# Patient Record
Sex: Female | Born: 1935
Health system: Southern US, Community
[De-identification: ages and names within clinical notes are randomized; demographics above are authoritative.]

---

## 1999-07-02 ENCOUNTER — Encounter: Payer: Self-pay | Admitting: Emergency Medicine

## 1999-07-02 ENCOUNTER — Emergency Department (HOSPITAL_COMMUNITY): Admission: EM | Admit: 1999-07-02 | Discharge: 1999-07-02 | Payer: Self-pay | Admitting: Emergency Medicine

## 1999-08-14 ENCOUNTER — Other Ambulatory Visit: Admission: RE | Admit: 1999-08-14 | Discharge: 1999-08-14 | Payer: Self-pay | Admitting: Obstetrics and Gynecology

## 1999-08-27 ENCOUNTER — Encounter: Payer: Self-pay | Admitting: Obstetrics and Gynecology

## 1999-08-27 ENCOUNTER — Ambulatory Visit (HOSPITAL_COMMUNITY): Admission: RE | Admit: 1999-08-27 | Discharge: 1999-08-27 | Payer: Self-pay | Admitting: Obstetrics and Gynecology

## 2000-09-30 ENCOUNTER — Other Ambulatory Visit: Admission: RE | Admit: 2000-09-30 | Discharge: 2000-09-30 | Payer: Self-pay | Admitting: Obstetrics and Gynecology

## 2000-09-30 ENCOUNTER — Ambulatory Visit (HOSPITAL_COMMUNITY): Admission: RE | Admit: 2000-09-30 | Discharge: 2000-09-30 | Payer: Self-pay | Admitting: Obstetrics and Gynecology

## 2000-09-30 ENCOUNTER — Encounter: Payer: Self-pay | Admitting: Obstetrics and Gynecology

## 2001-06-23 ENCOUNTER — Ambulatory Visit (HOSPITAL_COMMUNITY): Admission: RE | Admit: 2001-06-23 | Discharge: 2001-06-23 | Payer: Self-pay | Admitting: *Deleted

## 2001-06-23 ENCOUNTER — Encounter (INDEPENDENT_AMBULATORY_CARE_PROVIDER_SITE_OTHER): Payer: Self-pay | Admitting: *Deleted

## 2001-11-10 ENCOUNTER — Ambulatory Visit (HOSPITAL_COMMUNITY): Admission: RE | Admit: 2001-11-10 | Discharge: 2001-11-10 | Payer: Self-pay | Admitting: Pediatrics

## 2001-11-10 ENCOUNTER — Encounter: Payer: Self-pay | Admitting: Obstetrics and Gynecology

## 2002-11-07 ENCOUNTER — Other Ambulatory Visit: Admission: RE | Admit: 2002-11-07 | Discharge: 2002-11-07 | Payer: Self-pay | Admitting: Obstetrics and Gynecology

## 2002-11-16 ENCOUNTER — Ambulatory Visit (HOSPITAL_COMMUNITY): Admission: RE | Admit: 2002-11-16 | Discharge: 2002-11-16 | Payer: Self-pay | Admitting: Obstetrics and Gynecology

## 2002-11-16 ENCOUNTER — Encounter: Payer: Self-pay | Admitting: Obstetrics and Gynecology

## 2003-11-01 ENCOUNTER — Encounter: Admission: RE | Admit: 2003-11-01 | Discharge: 2003-11-01 | Payer: Self-pay | Admitting: Orthopaedic Surgery

## 2003-12-09 ENCOUNTER — Encounter: Admission: RE | Admit: 2003-12-09 | Discharge: 2003-12-09 | Payer: Self-pay | Admitting: Family Medicine

## 2004-06-03 ENCOUNTER — Encounter: Admission: RE | Admit: 2004-06-03 | Discharge: 2004-06-03 | Payer: Self-pay | Admitting: Orthopaedic Surgery

## 2004-06-05 ENCOUNTER — Encounter: Admission: RE | Admit: 2004-06-05 | Discharge: 2004-06-05 | Payer: Self-pay | Admitting: Orthopaedic Surgery

## 2004-06-18 ENCOUNTER — Other Ambulatory Visit: Admission: RE | Admit: 2004-06-18 | Discharge: 2004-06-18 | Payer: Self-pay | Admitting: Family Medicine

## 2004-07-01 ENCOUNTER — Ambulatory Visit (HOSPITAL_COMMUNITY): Admission: RE | Admit: 2004-07-01 | Discharge: 2004-07-01 | Payer: Self-pay | Admitting: Orthopedic Surgery

## 2005-01-12 ENCOUNTER — Encounter: Admission: RE | Admit: 2005-01-12 | Discharge: 2005-01-12 | Payer: Self-pay | Admitting: Family Medicine

## 2006-04-04 ENCOUNTER — Encounter: Admission: RE | Admit: 2006-04-04 | Discharge: 2006-04-04 | Payer: Self-pay | Admitting: Family Medicine

## 2006-12-12 ENCOUNTER — Encounter: Admission: RE | Admit: 2006-12-12 | Discharge: 2006-12-12 | Payer: Self-pay | Admitting: Family Medicine

## 2007-04-07 ENCOUNTER — Encounter: Admission: RE | Admit: 2007-04-07 | Discharge: 2007-04-07 | Payer: Self-pay | Admitting: Family Medicine

## 2007-08-08 ENCOUNTER — Other Ambulatory Visit: Admission: RE | Admit: 2007-08-08 | Discharge: 2007-08-08 | Payer: Self-pay | Admitting: Family Medicine

## 2008-04-22 ENCOUNTER — Encounter: Admission: RE | Admit: 2008-04-22 | Discharge: 2008-04-22 | Payer: Self-pay | Admitting: Family Medicine

## 2009-03-30 ENCOUNTER — Ambulatory Visit (HOSPITAL_BASED_OUTPATIENT_CLINIC_OR_DEPARTMENT_OTHER): Admission: RE | Admit: 2009-03-30 | Discharge: 2009-03-30 | Payer: Self-pay | Admitting: Family Medicine

## 2009-03-30 ENCOUNTER — Encounter: Payer: Self-pay | Admitting: Internal Medicine

## 2009-04-05 ENCOUNTER — Ambulatory Visit: Payer: Self-pay | Admitting: Internal Medicine

## 2009-05-22 ENCOUNTER — Ambulatory Visit: Payer: Self-pay | Admitting: Internal Medicine

## 2009-05-22 DIAGNOSIS — G4700 Insomnia, unspecified: Secondary | ICD-10-CM | POA: Insufficient documentation

## 2009-05-22 DIAGNOSIS — J309 Allergic rhinitis, unspecified: Secondary | ICD-10-CM | POA: Insufficient documentation

## 2009-06-06 ENCOUNTER — Encounter: Admission: RE | Admit: 2009-06-06 | Discharge: 2009-06-06 | Payer: Self-pay | Admitting: Family Medicine

## 2009-12-02 ENCOUNTER — Ambulatory Visit (HOSPITAL_COMMUNITY): Admission: RE | Admit: 2009-12-02 | Discharge: 2009-12-03 | Payer: Self-pay | Admitting: Surgery

## 2009-12-02 ENCOUNTER — Encounter (INDEPENDENT_AMBULATORY_CARE_PROVIDER_SITE_OTHER): Payer: Self-pay | Admitting: Surgery

## 2010-04-03 ENCOUNTER — Encounter: Admission: RE | Admit: 2010-04-03 | Discharge: 2010-04-03 | Payer: Self-pay | Admitting: Orthopedic Surgery

## 2010-07-01 ENCOUNTER — Encounter: Admission: RE | Admit: 2010-07-01 | Discharge: 2010-07-01 | Payer: Self-pay | Admitting: Orthopedic Surgery

## 2010-08-11 ENCOUNTER — Encounter: Admission: RE | Admit: 2010-08-11 | Discharge: 2010-08-11 | Payer: Self-pay | Admitting: Family Medicine

## 2010-11-30 LAB — COMPREHENSIVE METABOLIC PANEL
ALT: 23 U/L (ref 0–35)
Alkaline Phosphatase: 61 U/L (ref 39–117)
CO2: 28 mEq/L (ref 19–32)
Calcium: 9.3 mg/dL (ref 8.4–10.5)
Chloride: 104 mEq/L (ref 96–112)
GFR calc Af Amer: >60 mL/min (ref >60)
Glucose, Bld: 88 mg/dL (ref 70–99)
Potassium: 4.6 mEq/L (ref 3.5–5.1)
Total Protein: 7.4 g/dL (ref 6.0–8.3)

## 2010-11-30 LAB — DIFFERENTIAL
Basophils Absolute: 0.1 10*3/uL (ref 0.0–0.1)
Lymphocytes Relative: 28 % (ref 12–46)
Lymphs Abs: 1.7 10*3/uL (ref 0.7–4.0)
Neutrophils Relative %: 59 % (ref 43–77)

## 2010-11-30 LAB — PROTIME-INR
INR: 0.97 (ref 0.00–1.49)
Prothrombin Time: 12.8 seconds (ref 11.6–15.2)

## 2010-11-30 LAB — CBC
Hemoglobin: 14.3 g/dL (ref 12.0–15.0)
MCV: 93.2 fL (ref 78.0–100.0)
RBC: 4.55 MIL/uL (ref 3.87–5.11)
WBC: 6.1 10*3/uL (ref 4.0–10.5)

## 2010-11-30 LAB — URINALYSIS, ROUTINE W REFLEX MICROSCOPIC
Glucose, UA: NEGATIVE mg/dL
Ketones, ur: NEGATIVE mg/dL
Specific Gravity, Urine: 1.017 (ref 1.005–1.030)

## 2010-11-30 LAB — URINE MICROSCOPIC-ADD ON

## 2011-01-19 NOTE — Procedures (Signed)
Katie Hahn, Katie Hahn                  ACCOUNT NO.:  [id number]   MEDICAL RECORD NO.:  [id number]          PATIENT TYPE:  OUT   LOCATION:  SLEEP CENTER                 FACILITY:  [facility]   PHYSICIAN:  Clinton D. Maple Hudson, MD, FCCP, FACPDATE OF BIRTH:  04-04-35   DATE OF STUDY:  03/30/2009                            NOCTURNAL POLYSOMNOGRAM   REFERRING PHYSICIAN:  Molly Maduro L. Foy [place], M.D.   INDICATION FOR STUDY:  Insomnia with sleep apnea.   EPWORTH SLEEPINESS SCORE:  Epworth sleepiness score 12/24, BMI 23.3.  Weight 140 pounds, height 65 inches.  Neck 14 inches.   HOME MEDICATIONS:  Charted and reviewed.   SLEEP ARCHITECTURE:  Total sleep time 104 minutes with sleep efficiency  26.6%.  Stage I was 25.5%, stage II was 74.5%, stages III and REM were  absent.  Sleep latency 148 minutes.  Awake after sleep onset 98 minutes,  arousal index 87.1 indicating increased EEG arousal.  No bedtime  medications were taken.   RESPIRATORY DATA:  Apnea/hypopnea index (AHI) 0 per hour.  An additional  43 events not meeting duration and intensity criteria for standard  diagnoses as apneas or hypopneas, were counted as respiratory effort  related arousals giving a cumulative respiratory disturbance index (RDI)  of 24.8 per hour.  There were insufficient apneas and hypopneas to  permit CPAP titration by split protocol on the study night.  The patient  also indicated that she could not sleep with door closed and could not  stand anything on her skin, including a trial of CPAP mask.   OXYGEN DATA:  Mild snoring with oxygen desaturation to a nadir of 93%.  Mean oxygen saturation through the study 96.4% on room air.   CARDIAC DATA:  Normal sinus rhythm.   MOVEMENT-PARASOMNIA:  Frequent limb jerks with a total of 76 of which 34  were associated with arousal or awakening for periodic limb movement  with arousal index of 19.6 per hour.   IMPRESSIONS-RECOMMENDATIONS:  1. Sleep architecture marked by delayed onset  at 11:30 p.m. and      frequent waking through the night before final waking a little      after 3:00 a.m.  Absent deeper stages including stages III and      REM.  No bedtime medication was reported although she does have      Lunesta, hydroxyzine and diazepam listed.  Note that beta blockers      such as propranolol have been associated with sleep disturbance,      insomnia and in some cases vivid dreaming or nightmares.  2. Periodic limb movement with arousal syndrome, 19.6 per hour.      Consider trial of specific therapy such as ReQuip or Mirapex.  An      alternative to sedative muscle relaxer could be clonazepam in the      appropriate clinical situation.  3. Mild respiratory sleep disturbance.  Her AHI was 0 but counting      milder and less specific respiratory effort related arousals for an      RDI of 24.8 per hour by current scoring rules, she may be  considered to have mild obstructive sleep apnea/hypopnea syndrome.      Oxygen is well maintained and snoring mild.  She would need      considerable desensitization to be able to tolerate a CPAP mask and      that may not be a therapeutically important direction to pursue.      Alternatively, she might benefit from encouragement to sleep off      the flat of her back and to treat any significant nasal or upper      airway congestion or obstruction.  An oral appliance would be an      additional option.      Clinton D. Maple Hudson, MD, [facility], FACP  Diplomate, Biomedical engineer of Sleep Medicine  Electronically Signed     CDY/MEDQ  D:  04/06/2009 09:48:58  T:  04/06/2009 10:43:20  Job:  [id number]

## 2011-08-27 ENCOUNTER — Other Ambulatory Visit: Payer: Self-pay | Admitting: Family Medicine

## 2011-08-27 DIAGNOSIS — Z1231 Encounter for screening mammogram for malignant neoplasm of breast: Secondary | ICD-10-CM

## 2011-09-14 ENCOUNTER — Ambulatory Visit
Admission: RE | Admit: 2011-09-14 | Discharge: 2011-09-14 | Disposition: A | Discharge status: Home / Self Care | Payer: PRIVATE HEALTH INSURANCE | Source: Ambulatory Visit | Attending: Family Medicine | Admitting: Family Medicine

## 2011-09-14 DIAGNOSIS — Z1231 Encounter for screening mammogram for malignant neoplasm of breast: Secondary | ICD-10-CM

## 2012-02-18 ENCOUNTER — Ambulatory Visit (HOSPITAL_COMMUNITY)
Admission: RE | Admit: 2012-02-18 | Discharge: 2012-02-18 | Disposition: A | Discharge status: Home / Self Care | Payer: Medicare Other | Source: Ambulatory Visit | Attending: Dermatology | Admitting: Dermatology

## 2012-02-18 ENCOUNTER — Other Ambulatory Visit (HOSPITAL_COMMUNITY): Payer: Self-pay | Admitting: Dermatology

## 2012-02-18 DIAGNOSIS — M79609 Pain in unspecified limb: Secondary | ICD-10-CM | POA: Insufficient documentation

## 2012-02-18 DIAGNOSIS — M79606 Pain in leg, unspecified: Secondary | ICD-10-CM

## 2012-02-18 DIAGNOSIS — M7989 Other specified soft tissue disorders: Secondary | ICD-10-CM | POA: Insufficient documentation

## 2012-09-07 ENCOUNTER — Other Ambulatory Visit: Payer: Self-pay | Admitting: Family Medicine

## 2012-09-07 DIAGNOSIS — Z1231 Encounter for screening mammogram for malignant neoplasm of breast: Secondary | ICD-10-CM

## 2012-09-12 ENCOUNTER — Ambulatory Visit: Payer: Medicare Other

## 2012-09-14 ENCOUNTER — Ambulatory Visit: Payer: Medicare Other

## 2013-03-12 ENCOUNTER — Other Ambulatory Visit: Payer: Self-pay | Admitting: Family Medicine

## 2013-03-12 DIAGNOSIS — Z1231 Encounter for screening mammogram for malignant neoplasm of breast: Secondary | ICD-10-CM

## 2013-03-13 ENCOUNTER — Ambulatory Visit (INDEPENDENT_AMBULATORY_CARE_PROVIDER_SITE_OTHER): Payer: Medicare Other

## 2013-03-13 DIAGNOSIS — Z1231 Encounter for screening mammogram for malignant neoplasm of breast: Secondary | ICD-10-CM

## 2013-03-21 ENCOUNTER — Other Ambulatory Visit: Payer: Self-pay | Admitting: Orthopaedic Surgery

## 2013-03-21 DIAGNOSIS — M79672 Pain in left foot: Secondary | ICD-10-CM

## 2013-03-30 ENCOUNTER — Ambulatory Visit
Admission: RE | Admit: 2013-03-30 | Discharge: 2013-03-30 | Disposition: A | Discharge status: Home / Self Care | Payer: Medicare Other | Source: Ambulatory Visit | Attending: Orthopaedic Surgery | Admitting: Orthopaedic Surgery

## 2013-03-30 DIAGNOSIS — M79672 Pain in left foot: Secondary | ICD-10-CM

## 2014-03-28 ENCOUNTER — Other Ambulatory Visit: Payer: Self-pay | Admitting: Family Medicine

## 2014-03-28 DIAGNOSIS — Z1231 Encounter for screening mammogram for malignant neoplasm of breast: Secondary | ICD-10-CM

## 2014-04-01 ENCOUNTER — Other Ambulatory Visit: Payer: Self-pay | Admitting: Dermatology

## 2014-04-11 ENCOUNTER — Ambulatory Visit (INDEPENDENT_AMBULATORY_CARE_PROVIDER_SITE_OTHER): Payer: Medicare Other

## 2014-04-11 DIAGNOSIS — Z1231 Encounter for screening mammogram for malignant neoplasm of breast: Secondary | ICD-10-CM

## 2014-06-10 ENCOUNTER — Other Ambulatory Visit: Payer: Self-pay | Admitting: Dermatology

## 2015-04-04 ENCOUNTER — Other Ambulatory Visit (HOSPITAL_BASED_OUTPATIENT_CLINIC_OR_DEPARTMENT_OTHER): Payer: Self-pay | Admitting: Family Medicine

## 2015-04-04 DIAGNOSIS — R251 Tremor, unspecified: Secondary | ICD-10-CM

## 2015-04-04 DIAGNOSIS — G453 Amaurosis fugax: Secondary | ICD-10-CM

## 2015-04-05 ENCOUNTER — Ambulatory Visit (HOSPITAL_BASED_OUTPATIENT_CLINIC_OR_DEPARTMENT_OTHER)
Admission: RE | Admit: 2015-04-05 | Discharge: 2015-04-05 | Disposition: A | Discharge status: Home / Self Care | Payer: Medicare Other | Source: Ambulatory Visit | Attending: Family Medicine | Admitting: Family Medicine

## 2015-04-05 DIAGNOSIS — G936 Cerebral edema: Secondary | ICD-10-CM | POA: Diagnosis not present

## 2015-04-05 DIAGNOSIS — I671 Cerebral aneurysm, nonruptured: Secondary | ICD-10-CM | POA: Insufficient documentation

## 2015-04-05 DIAGNOSIS — G453 Amaurosis fugax: Secondary | ICD-10-CM

## 2015-04-05 DIAGNOSIS — I739 Peripheral vascular disease, unspecified: Secondary | ICD-10-CM | POA: Insufficient documentation

## 2015-04-05 DIAGNOSIS — R55 Syncope and collapse: Secondary | ICD-10-CM | POA: Diagnosis present

## 2015-04-05 DIAGNOSIS — R251 Tremor, unspecified: Secondary | ICD-10-CM

## 2015-05-28 ENCOUNTER — Other Ambulatory Visit: Payer: Self-pay | Admitting: Family Medicine

## 2015-05-28 DIAGNOSIS — Z1231 Encounter for screening mammogram for malignant neoplasm of breast: Secondary | ICD-10-CM

## 2015-06-19 ENCOUNTER — Ambulatory Visit (INDEPENDENT_AMBULATORY_CARE_PROVIDER_SITE_OTHER): Payer: Medicare Other

## 2015-06-19 DIAGNOSIS — Z1231 Encounter for screening mammogram for malignant neoplasm of breast: Secondary | ICD-10-CM | POA: Diagnosis not present

## 2016-04-01 ENCOUNTER — Other Ambulatory Visit: Payer: Self-pay | Admitting: Specialist

## 2016-04-01 DIAGNOSIS — R519 Headache, unspecified: Secondary | ICD-10-CM

## 2016-04-01 DIAGNOSIS — R51 Headache: Principal | ICD-10-CM

## 2016-05-14 ENCOUNTER — Ambulatory Visit
Admission: RE | Admit: 2016-05-14 | Discharge: 2016-05-14 | Disposition: A | Discharge status: Home / Self Care | Payer: Medicare Other | Source: Ambulatory Visit | Attending: Specialist | Admitting: Specialist

## 2016-05-14 DIAGNOSIS — R519 Headache, unspecified: Secondary | ICD-10-CM

## 2016-05-14 DIAGNOSIS — R51 Headache: Principal | ICD-10-CM

## 2016-07-13 ENCOUNTER — Other Ambulatory Visit: Payer: Self-pay | Admitting: Physician Assistant

## 2016-07-13 DIAGNOSIS — Z1231 Encounter for screening mammogram for malignant neoplasm of breast: Secondary | ICD-10-CM

## 2016-08-03 ENCOUNTER — Ambulatory Visit (INDEPENDENT_AMBULATORY_CARE_PROVIDER_SITE_OTHER): Payer: Medicare Other

## 2016-08-03 DIAGNOSIS — R928 Other abnormal and inconclusive findings on diagnostic imaging of breast: Secondary | ICD-10-CM | POA: Diagnosis not present

## 2016-08-03 DIAGNOSIS — Z1231 Encounter for screening mammogram for malignant neoplasm of breast: Secondary | ICD-10-CM

## 2016-08-05 ENCOUNTER — Other Ambulatory Visit: Payer: Self-pay | Admitting: Physician Assistant

## 2016-08-05 DIAGNOSIS — R928 Other abnormal and inconclusive findings on diagnostic imaging of breast: Secondary | ICD-10-CM

## 2016-08-11 ENCOUNTER — Ambulatory Visit
Admission: RE | Admit: 2016-08-11 | Discharge: 2016-08-11 | Disposition: A | Discharge status: Home / Self Care | Payer: Medicare Other | Source: Ambulatory Visit | Attending: Physician Assistant | Admitting: Physician Assistant

## 2016-08-11 DIAGNOSIS — R928 Other abnormal and inconclusive findings on diagnostic imaging of breast: Secondary | ICD-10-CM

## 2016-09-09 DIAGNOSIS — G4700 Insomnia, unspecified: Secondary | ICD-10-CM | POA: Diagnosis not present

## 2016-09-09 DIAGNOSIS — R002 Palpitations: Secondary | ICD-10-CM | POA: Diagnosis not present

## 2016-09-09 DIAGNOSIS — R251 Tremor, unspecified: Secondary | ICD-10-CM | POA: Diagnosis not present

## 2016-09-15 DIAGNOSIS — R69 Illness, unspecified: Secondary | ICD-10-CM | POA: Diagnosis not present

## 2016-10-01 DIAGNOSIS — Z961 Presence of intraocular lens: Secondary | ICD-10-CM | POA: Diagnosis not present

## 2016-10-01 DIAGNOSIS — H3403 Transient retinal artery occlusion, bilateral: Secondary | ICD-10-CM | POA: Diagnosis not present

## 2016-10-01 DIAGNOSIS — H04121 Dry eye syndrome of right lacrimal gland: Secondary | ICD-10-CM | POA: Diagnosis not present

## 2016-10-12 DIAGNOSIS — R002 Palpitations: Secondary | ICD-10-CM | POA: Diagnosis not present

## 2016-10-12 DIAGNOSIS — H5213 Myopia, bilateral: Secondary | ICD-10-CM | POA: Diagnosis not present

## 2016-10-12 DIAGNOSIS — H53129 Transient visual loss, unspecified eye: Secondary | ICD-10-CM | POA: Diagnosis not present

## 2016-10-21 DIAGNOSIS — R42 Dizziness and giddiness: Secondary | ICD-10-CM | POA: Diagnosis not present

## 2016-10-21 DIAGNOSIS — J019 Acute sinusitis, unspecified: Secondary | ICD-10-CM | POA: Diagnosis not present

## 2016-11-09 DIAGNOSIS — D485 Neoplasm of uncertain behavior of skin: Secondary | ICD-10-CM | POA: Diagnosis not present

## 2016-11-09 DIAGNOSIS — Z23 Encounter for immunization: Secondary | ICD-10-CM | POA: Diagnosis not present

## 2016-11-09 DIAGNOSIS — L814 Other melanin hyperpigmentation: Secondary | ICD-10-CM | POA: Diagnosis not present

## 2016-11-09 DIAGNOSIS — L570 Actinic keratosis: Secondary | ICD-10-CM | POA: Diagnosis not present

## 2016-12-14 DIAGNOSIS — L570 Actinic keratosis: Secondary | ICD-10-CM | POA: Diagnosis not present

## 2016-12-14 DIAGNOSIS — L820 Inflamed seborrheic keratosis: Secondary | ICD-10-CM | POA: Diagnosis not present

## 2016-12-29 DIAGNOSIS — R69 Illness, unspecified: Secondary | ICD-10-CM | POA: Diagnosis not present

## 2017-01-04 DIAGNOSIS — H6123 Impacted cerumen, bilateral: Secondary | ICD-10-CM | POA: Diagnosis not present

## 2017-01-04 DIAGNOSIS — R42 Dizziness and giddiness: Secondary | ICD-10-CM | POA: Diagnosis not present

## 2017-01-04 DIAGNOSIS — H903 Sensorineural hearing loss, bilateral: Secondary | ICD-10-CM | POA: Diagnosis not present

## 2017-01-12 DIAGNOSIS — R300 Dysuria: Secondary | ICD-10-CM | POA: Diagnosis not present

## 2017-01-12 DIAGNOSIS — B373 Candidiasis of vulva and vagina: Secondary | ICD-10-CM | POA: Diagnosis not present

## 2017-01-14 DIAGNOSIS — N898 Other specified noninflammatory disorders of vagina: Secondary | ICD-10-CM | POA: Diagnosis not present

## 2017-01-14 DIAGNOSIS — B372 Candidiasis of skin and nail: Secondary | ICD-10-CM | POA: Diagnosis not present

## 2017-01-24 DIAGNOSIS — N952 Postmenopausal atrophic vaginitis: Secondary | ICD-10-CM | POA: Diagnosis not present

## 2017-02-04 DIAGNOSIS — R0781 Pleurodynia: Secondary | ICD-10-CM | POA: Diagnosis not present

## 2017-02-04 DIAGNOSIS — Z0000 Encounter for general adult medical examination without abnormal findings: Secondary | ICD-10-CM | POA: Diagnosis not present

## 2017-02-04 DIAGNOSIS — S060X0A Concussion without loss of consciousness, initial encounter: Secondary | ICD-10-CM | POA: Diagnosis not present

## 2017-02-08 DIAGNOSIS — M545 Low back pain: Secondary | ICD-10-CM | POA: Diagnosis not present

## 2017-02-08 DIAGNOSIS — M419 Scoliosis, unspecified: Secondary | ICD-10-CM | POA: Diagnosis not present

## 2017-02-11 DIAGNOSIS — N762 Acute vulvitis: Secondary | ICD-10-CM | POA: Diagnosis not present

## 2017-02-16 DIAGNOSIS — L280 Lichen simplex chronicus: Secondary | ICD-10-CM | POA: Diagnosis not present

## 2017-03-07 DIAGNOSIS — N762 Acute vulvitis: Secondary | ICD-10-CM | POA: Diagnosis not present

## 2017-03-07 DIAGNOSIS — L280 Lichen simplex chronicus: Secondary | ICD-10-CM | POA: Diagnosis not present

## 2017-03-29 DIAGNOSIS — R69 Illness, unspecified: Secondary | ICD-10-CM | POA: Diagnosis not present

## 2017-06-01 DIAGNOSIS — G8929 Other chronic pain: Secondary | ICD-10-CM | POA: Diagnosis not present

## 2017-06-01 DIAGNOSIS — M5441 Lumbago with sciatica, right side: Secondary | ICD-10-CM | POA: Diagnosis not present

## 2017-06-01 DIAGNOSIS — M25561 Pain in right knee: Secondary | ICD-10-CM | POA: Diagnosis not present

## 2017-06-07 DIAGNOSIS — L821 Other seborrheic keratosis: Secondary | ICD-10-CM | POA: Diagnosis not present

## 2017-06-07 DIAGNOSIS — L814 Other melanin hyperpigmentation: Secondary | ICD-10-CM | POA: Diagnosis not present

## 2017-06-07 DIAGNOSIS — Z85828 Personal history of other malignant neoplasm of skin: Secondary | ICD-10-CM | POA: Diagnosis not present

## 2017-06-07 DIAGNOSIS — D225 Melanocytic nevi of trunk: Secondary | ICD-10-CM | POA: Diagnosis not present

## 2017-06-07 DIAGNOSIS — Z23 Encounter for immunization: Secondary | ICD-10-CM | POA: Diagnosis not present

## 2017-06-23 DIAGNOSIS — M545 Low back pain: Secondary | ICD-10-CM | POA: Diagnosis not present

## 2017-06-23 DIAGNOSIS — M419 Scoliosis, unspecified: Secondary | ICD-10-CM | POA: Diagnosis not present

## 2017-06-28 DIAGNOSIS — R69 Illness, unspecified: Secondary | ICD-10-CM | POA: Diagnosis not present

## 2017-08-09 ENCOUNTER — Other Ambulatory Visit: Payer: Self-pay

## 2017-08-09 DIAGNOSIS — Z1231 Encounter for screening mammogram for malignant neoplasm of breast: Secondary | ICD-10-CM

## 2017-08-11 DIAGNOSIS — G250 Essential tremor: Secondary | ICD-10-CM | POA: Diagnosis not present

## 2017-08-11 DIAGNOSIS — G2581 Restless legs syndrome: Secondary | ICD-10-CM | POA: Diagnosis not present

## 2017-08-11 DIAGNOSIS — R69 Illness, unspecified: Secondary | ICD-10-CM | POA: Diagnosis not present

## 2017-08-11 DIAGNOSIS — M5412 Radiculopathy, cervical region: Secondary | ICD-10-CM | POA: Diagnosis not present

## 2017-08-13 ENCOUNTER — Other Ambulatory Visit: Payer: Self-pay

## 2017-08-13 DIAGNOSIS — N644 Mastodynia: Secondary | ICD-10-CM

## 2017-08-17 ENCOUNTER — Other Ambulatory Visit: Payer: Self-pay | Admitting: Internal Medicine

## 2017-08-17 ENCOUNTER — Other Ambulatory Visit: Payer: Self-pay

## 2017-08-17 DIAGNOSIS — N644 Mastodynia: Secondary | ICD-10-CM

## 2017-08-18 ENCOUNTER — Other Ambulatory Visit: Payer: Self-pay | Admitting: Internal Medicine

## 2017-08-18 DIAGNOSIS — N644 Mastodynia: Secondary | ICD-10-CM

## 2017-08-24 ENCOUNTER — Ambulatory Visit: Payer: Medicare Other

## 2017-08-24 ENCOUNTER — Ambulatory Visit
Admission: RE | Admit: 2017-08-24 | Discharge: 2017-08-24 | Disposition: A | Discharge status: Home / Self Care | Payer: Medicare HMO | Source: Ambulatory Visit | Attending: Internal Medicine | Admitting: Internal Medicine

## 2017-08-24 DIAGNOSIS — R922 Inconclusive mammogram: Secondary | ICD-10-CM | POA: Diagnosis not present

## 2017-08-24 DIAGNOSIS — N644 Mastodynia: Secondary | ICD-10-CM

## 2017-09-22 DIAGNOSIS — B379 Candidiasis, unspecified: Secondary | ICD-10-CM | POA: Diagnosis not present

## 2017-09-29 DIAGNOSIS — R69 Illness, unspecified: Secondary | ICD-10-CM | POA: Diagnosis not present

## 2017-10-08 DIAGNOSIS — B373 Candidiasis of vulva and vagina: Secondary | ICD-10-CM | POA: Diagnosis not present

## 2017-10-08 DIAGNOSIS — N3000 Acute cystitis without hematuria: Secondary | ICD-10-CM | POA: Diagnosis not present

## 2017-10-11 DIAGNOSIS — N3000 Acute cystitis without hematuria: Secondary | ICD-10-CM | POA: Diagnosis not present

## 2017-10-13 DIAGNOSIS — L309 Dermatitis, unspecified: Secondary | ICD-10-CM | POA: Diagnosis not present

## 2017-10-13 DIAGNOSIS — L500 Allergic urticaria: Secondary | ICD-10-CM | POA: Diagnosis not present

## 2017-10-20 DIAGNOSIS — N760 Acute vaginitis: Secondary | ICD-10-CM | POA: Diagnosis not present

## 2017-10-28 DIAGNOSIS — H04121 Dry eye syndrome of right lacrimal gland: Secondary | ICD-10-CM | POA: Diagnosis not present

## 2017-10-28 DIAGNOSIS — Z961 Presence of intraocular lens: Secondary | ICD-10-CM | POA: Diagnosis not present

## 2017-11-01 DIAGNOSIS — L0100 Impetigo, unspecified: Secondary | ICD-10-CM | POA: Diagnosis not present

## 2017-11-01 DIAGNOSIS — L249 Irritant contact dermatitis, unspecified cause: Secondary | ICD-10-CM | POA: Diagnosis not present

## 2017-12-01 DIAGNOSIS — L309 Dermatitis, unspecified: Secondary | ICD-10-CM | POA: Diagnosis not present

## 2017-12-22 DIAGNOSIS — Z6824 Body mass index (BMI) 24.0-24.9, adult: Secondary | ICD-10-CM | POA: Diagnosis not present

## 2017-12-22 DIAGNOSIS — Z01419 Encounter for gynecological examination (general) (routine) without abnormal findings: Secondary | ICD-10-CM | POA: Diagnosis not present

## 2017-12-22 DIAGNOSIS — N952 Postmenopausal atrophic vaginitis: Secondary | ICD-10-CM | POA: Diagnosis not present

## 2017-12-27 ENCOUNTER — Other Ambulatory Visit: Payer: Self-pay | Admitting: Orthopedic Surgery

## 2017-12-27 ENCOUNTER — Ambulatory Visit
Admission: RE | Admit: 2017-12-27 | Discharge: 2017-12-27 | Disposition: A | Discharge status: Home / Self Care | Payer: Medicare HMO | Source: Ambulatory Visit | Attending: Orthopedic Surgery | Admitting: Orthopedic Surgery

## 2017-12-27 DIAGNOSIS — T148XXA Other injury of unspecified body region, initial encounter: Secondary | ICD-10-CM

## 2017-12-27 DIAGNOSIS — J984 Other disorders of lung: Secondary | ICD-10-CM | POA: Diagnosis not present

## 2017-12-27 DIAGNOSIS — S2232XA Fracture of one rib, left side, initial encounter for closed fracture: Secondary | ICD-10-CM | POA: Diagnosis not present

## 2017-12-27 DIAGNOSIS — M25561 Pain in right knee: Secondary | ICD-10-CM | POA: Diagnosis not present

## 2017-12-27 DIAGNOSIS — M25512 Pain in left shoulder: Secondary | ICD-10-CM | POA: Diagnosis not present

## 2017-12-29 DIAGNOSIS — R69 Illness, unspecified: Secondary | ICD-10-CM | POA: Diagnosis not present

## 2018-01-03 DIAGNOSIS — Z1389 Encounter for screening for other disorder: Secondary | ICD-10-CM | POA: Diagnosis not present

## 2018-01-04 ENCOUNTER — Other Ambulatory Visit: Payer: Self-pay | Admitting: Internal Medicine

## 2018-01-04 DIAGNOSIS — R911 Solitary pulmonary nodule: Secondary | ICD-10-CM

## 2018-01-05 ENCOUNTER — Ambulatory Visit
Admission: RE | Admit: 2018-01-05 | Discharge: 2018-01-05 | Disposition: A | Discharge status: Home / Self Care | Payer: Medicare HMO | Source: Ambulatory Visit | Attending: Internal Medicine | Admitting: Internal Medicine

## 2018-01-05 ENCOUNTER — Other Ambulatory Visit: Payer: Medicare HMO

## 2018-01-05 DIAGNOSIS — R911 Solitary pulmonary nodule: Secondary | ICD-10-CM

## 2018-01-05 DIAGNOSIS — J984 Other disorders of lung: Secondary | ICD-10-CM | POA: Diagnosis not present

## 2018-01-05 MED ORDER — IOPAMIDOL (ISOVUE-300) INJECTION 61%
75.0000 mL | Freq: Once | INTRAVENOUS | Status: AC | PRN
  Administered 2018-01-05: 75 mL via INTRAVENOUS

## 2018-01-11 DIAGNOSIS — R69 Illness, unspecified: Secondary | ICD-10-CM | POA: Diagnosis not present

## 2018-01-18 DIAGNOSIS — H6121 Impacted cerumen, right ear: Secondary | ICD-10-CM | POA: Diagnosis not present

## 2018-01-18 DIAGNOSIS — H938X1 Other specified disorders of right ear: Secondary | ICD-10-CM | POA: Diagnosis not present

## 2018-02-09 DIAGNOSIS — M84374A Stress fracture, right foot, initial encounter for fracture: Secondary | ICD-10-CM | POA: Diagnosis not present

## 2018-02-16 DIAGNOSIS — R05 Cough: Secondary | ICD-10-CM | POA: Diagnosis not present

## 2018-02-16 DIAGNOSIS — R0602 Shortness of breath: Secondary | ICD-10-CM | POA: Diagnosis not present

## 2018-02-16 DIAGNOSIS — R918 Other nonspecific abnormal finding of lung field: Secondary | ICD-10-CM | POA: Diagnosis not present

## 2018-03-06 DIAGNOSIS — R69 Illness, unspecified: Secondary | ICD-10-CM | POA: Diagnosis not present

## 2018-03-16 DIAGNOSIS — R69 Illness, unspecified: Secondary | ICD-10-CM | POA: Diagnosis not present

## 2018-04-05 DIAGNOSIS — M419 Scoliosis, unspecified: Secondary | ICD-10-CM | POA: Diagnosis not present

## 2018-04-05 DIAGNOSIS — M7918 Myalgia, other site: Secondary | ICD-10-CM | POA: Diagnosis not present

## 2018-04-05 DIAGNOSIS — M545 Low back pain: Secondary | ICD-10-CM | POA: Diagnosis not present

## 2018-04-11 DIAGNOSIS — R69 Illness, unspecified: Secondary | ICD-10-CM | POA: Diagnosis not present

## 2018-04-20 DIAGNOSIS — R69 Illness, unspecified: Secondary | ICD-10-CM | POA: Diagnosis not present

## 2018-04-27 DIAGNOSIS — M545 Low back pain: Secondary | ICD-10-CM | POA: Diagnosis not present

## 2018-05-02 DIAGNOSIS — M545 Low back pain: Secondary | ICD-10-CM | POA: Diagnosis not present

## 2018-05-02 DIAGNOSIS — G4700 Insomnia, unspecified: Secondary | ICD-10-CM | POA: Diagnosis not present

## 2018-05-02 DIAGNOSIS — Z0000 Encounter for general adult medical examination without abnormal findings: Secondary | ICD-10-CM | POA: Diagnosis not present

## 2018-05-02 DIAGNOSIS — K219 Gastro-esophageal reflux disease without esophagitis: Secondary | ICD-10-CM | POA: Diagnosis not present

## 2018-05-02 DIAGNOSIS — J029 Acute pharyngitis, unspecified: Secondary | ICD-10-CM | POA: Diagnosis not present

## 2018-05-05 DIAGNOSIS — M545 Low back pain: Secondary | ICD-10-CM | POA: Diagnosis not present

## 2018-05-11 DIAGNOSIS — M545 Low back pain: Secondary | ICD-10-CM | POA: Diagnosis not present

## 2018-05-15 DIAGNOSIS — R05 Cough: Secondary | ICD-10-CM | POA: Diagnosis not present

## 2018-05-15 DIAGNOSIS — R918 Other nonspecific abnormal finding of lung field: Secondary | ICD-10-CM | POA: Diagnosis not present

## 2018-05-15 DIAGNOSIS — R0602 Shortness of breath: Secondary | ICD-10-CM | POA: Diagnosis not present

## 2018-05-16 DIAGNOSIS — M545 Low back pain: Secondary | ICD-10-CM | POA: Diagnosis not present

## 2018-05-18 DIAGNOSIS — M545 Low back pain: Secondary | ICD-10-CM | POA: Diagnosis not present

## 2018-05-20 DIAGNOSIS — S80862A Insect bite (nonvenomous), left lower leg, initial encounter: Secondary | ICD-10-CM | POA: Diagnosis not present

## 2018-07-18 DIAGNOSIS — M25561 Pain in right knee: Secondary | ICD-10-CM | POA: Diagnosis not present

## 2018-07-18 DIAGNOSIS — M7121 Synovial cyst of popliteal space [Baker], right knee: Secondary | ICD-10-CM | POA: Diagnosis not present

## 2018-07-20 DIAGNOSIS — R69 Illness, unspecified: Secondary | ICD-10-CM | POA: Diagnosis not present

## 2018-07-25 IMAGING — CT CT CHEST W/ CM
1 series · 15 of 34 positions shown, 19 images · IV contrast (iopamidol)
Comparison: 12/27/2017

CLINICAL DATA: Abnormal chest x-ray. Possible right lower lung
nodule.

EXAM:
CT CHEST WITH CONTRAST
TECHNIQUE: Multidetector CT imaging of the chest was performed during
intravenous contrast administration.
CONTRAST:  75mL AII6WB-TXX IOPAMIDOL (AII6WB-TXX) INJECTION 61%

[Series 2: chest w/cm · axial · 0.55mm/px · z∈[+1025,+1257]mm · 15 of 138 slices shown, 19 images]
[im 11/138  mediastinal]
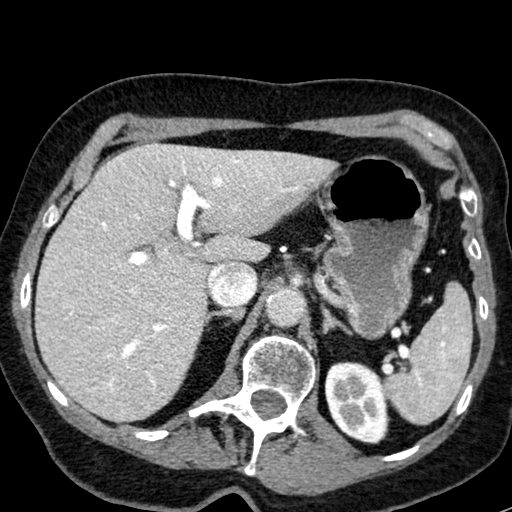
[im 11/138  lung]
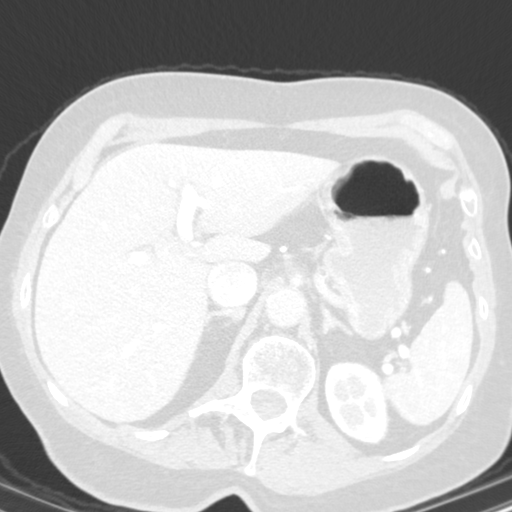
[im 21/138  lung]
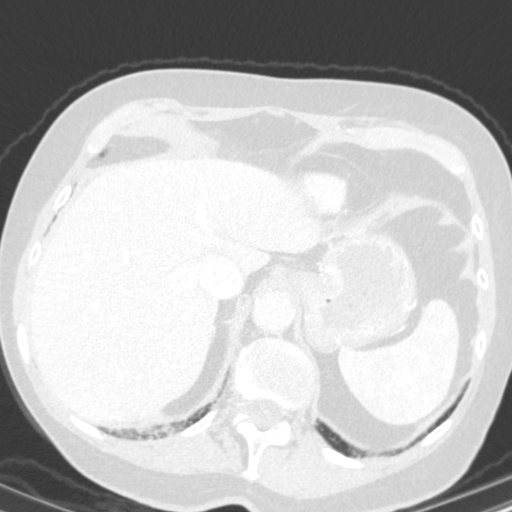
[im 28/138  lung]
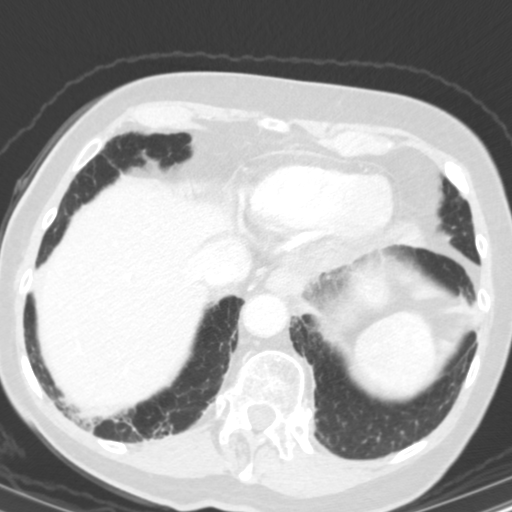
[im 36/138  lung]
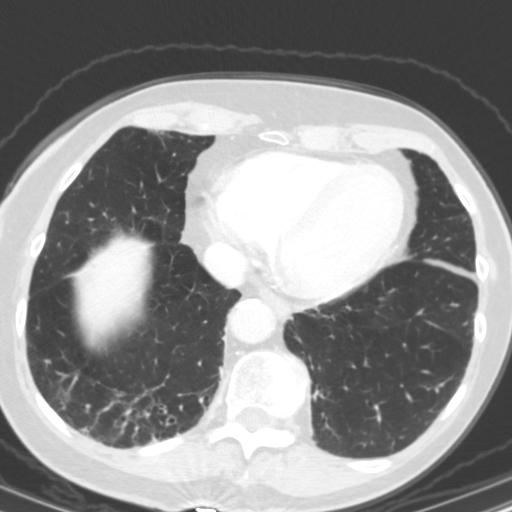
[im 46/138  mediastinal]
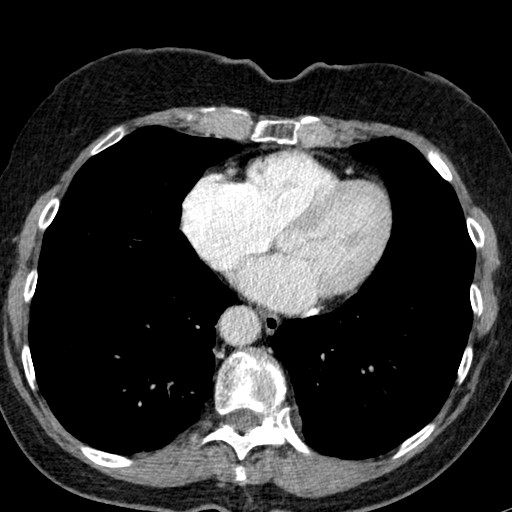
[im 46/138  lung]
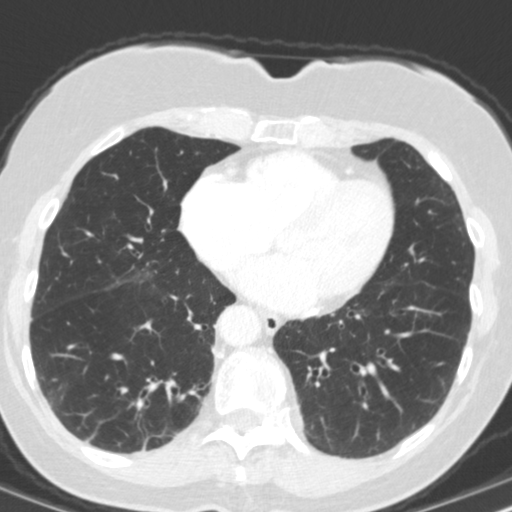
[im 55/138  lung]
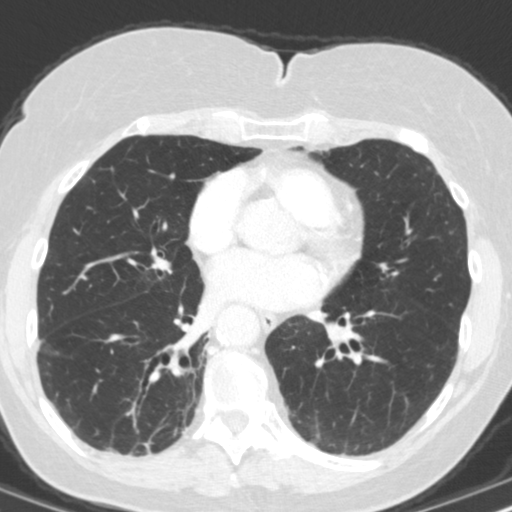
[im 61/138  lung]
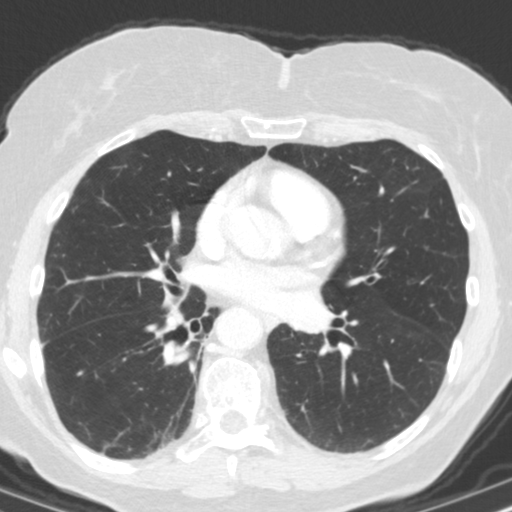
[im 72/138  lung]
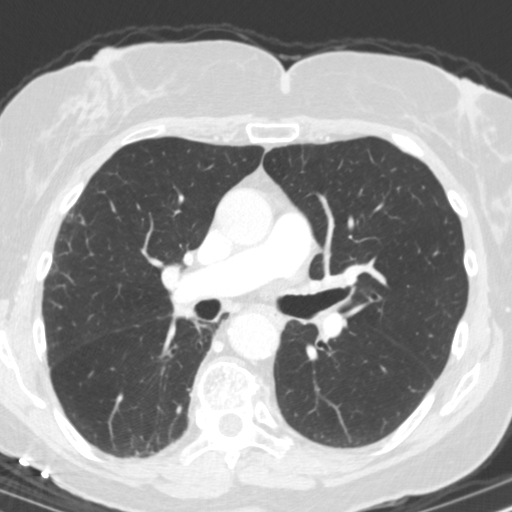
[im 77/138  mediastinal]
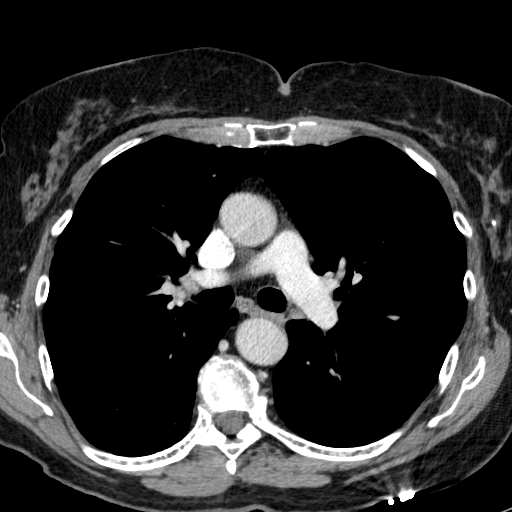
[im 77/138  lung]
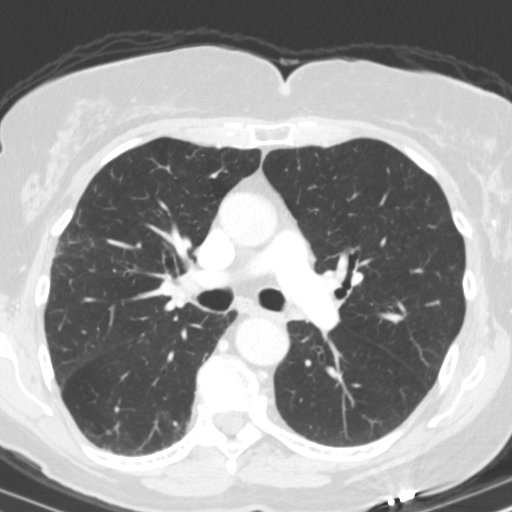
[im 83/138  lung]
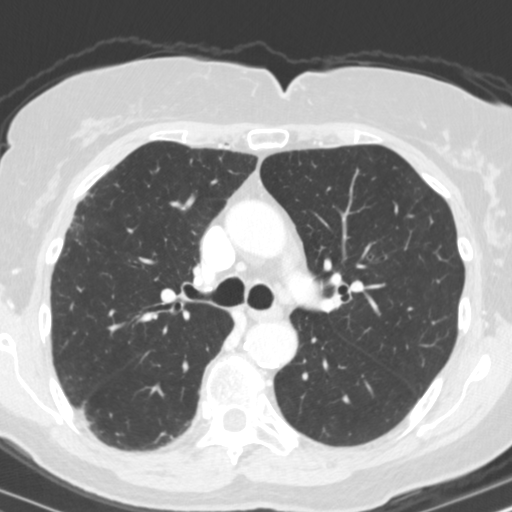
[im 92/138  lung]
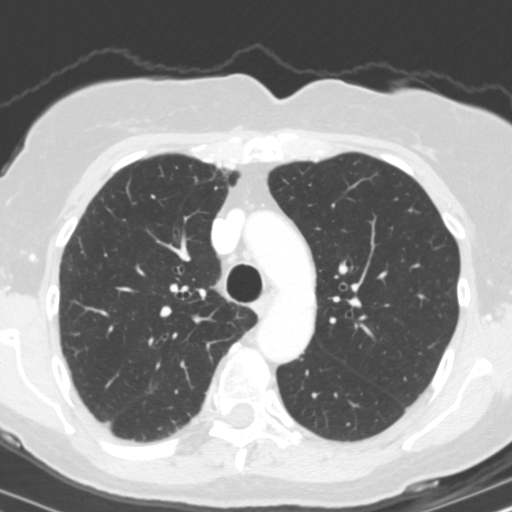
[im 102/138  lung]
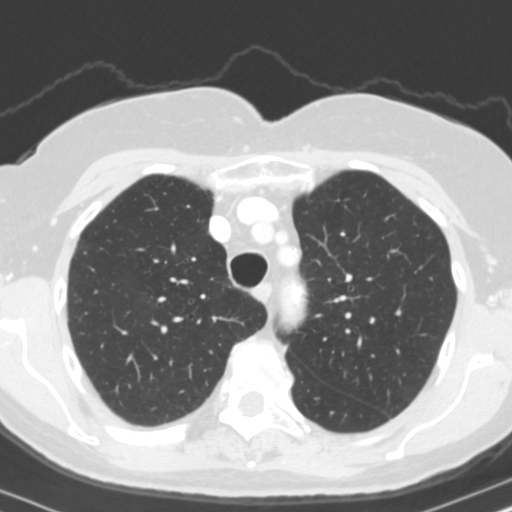
[im 110/138  mediastinal]
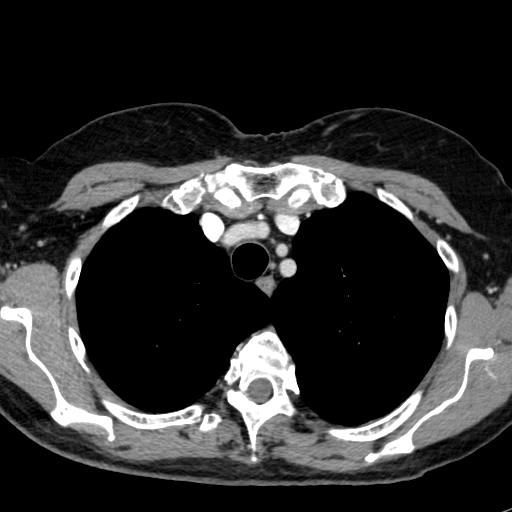
[im 110/138  lung]
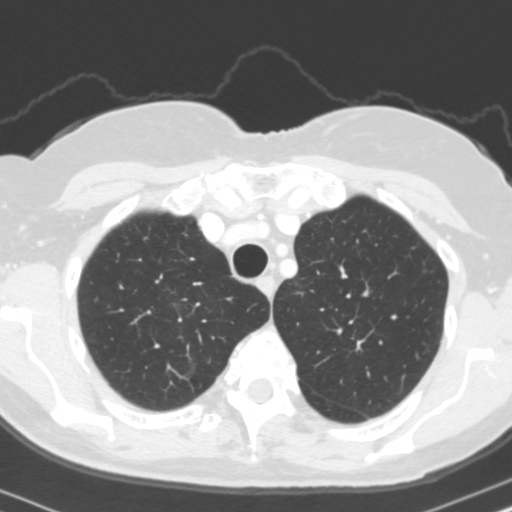
[im 117/138  lung]
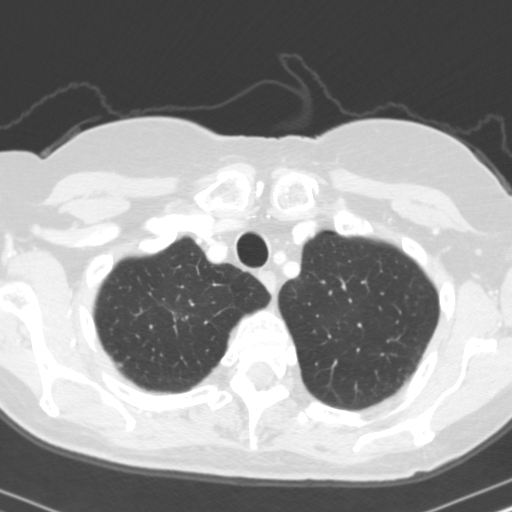
[im 127/138  lung]
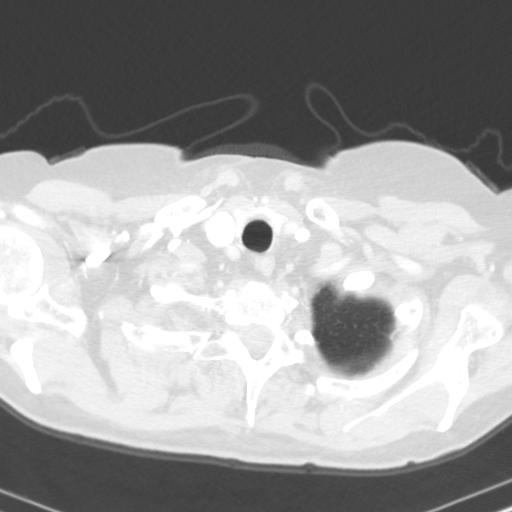

[15 of 34 positions shown; findings below may reference images not displayed]

FINDINGS: Cardiovascular: Moderate aortic calcifications. No aneurysm. Heart
is normal size.

Mediastinum/Nodes: No mediastinal, hilar, or axillary adenopathy.

Lungs/Pleura: Biapical scarring. Calcified granuloma in the right
upper lobe on image 50. Small calcified granuloma in the posterior
right lower lobe on image 113. No suspicious noncalcified pulmonary
nodules. Mild bronchiectasis in the right lower lobe with scarring.
No effusions.

Upper Abdomen: Imaging into the upper abdomen shows no acute
findings.

Musculoskeletal: Chest wall soft tissues are unremarkable. No acute
bony abnormality.
IMPRESSION: Small calcified granulomas in the right lung. The right upper lobe
nodule may correspond to the density seen on prior chest x-ray. No
noncalcified nodules noted.

Bronchiectasis and scarring in the right lung base.

Biapical scarring.

Aortic Atherosclerosis ([id number].Y).

## 2018-07-31 DIAGNOSIS — R69 Illness, unspecified: Secondary | ICD-10-CM | POA: Diagnosis not present

## 2018-08-11 DIAGNOSIS — M7121 Synovial cyst of popliteal space [Baker], right knee: Secondary | ICD-10-CM | POA: Diagnosis not present

## 2018-08-24 DIAGNOSIS — M25561 Pain in right knee: Secondary | ICD-10-CM | POA: Diagnosis not present

## 2018-08-24 DIAGNOSIS — M7121 Synovial cyst of popliteal space [Baker], right knee: Secondary | ICD-10-CM | POA: Diagnosis not present

## 2018-09-05 DIAGNOSIS — H6121 Impacted cerumen, right ear: Secondary | ICD-10-CM | POA: Diagnosis not present

## 2018-09-21 ENCOUNTER — Other Ambulatory Visit: Payer: Self-pay | Admitting: Physician Assistant

## 2018-09-21 DIAGNOSIS — M25561 Pain in right knee: Secondary | ICD-10-CM

## 2018-09-23 ENCOUNTER — Ambulatory Visit
Admission: RE | Admit: 2018-09-23 | Discharge: 2018-09-23 | Disposition: A | Discharge status: Home / Self Care | Payer: Medicare HMO | Source: Ambulatory Visit | Attending: Physician Assistant | Admitting: Physician Assistant

## 2018-09-23 DIAGNOSIS — M25561 Pain in right knee: Secondary | ICD-10-CM

## 2018-11-14 DIAGNOSIS — M2351 Chronic instability of knee, right knee: Secondary | ICD-10-CM | POA: Diagnosis not present

## 2019-03-05 DIAGNOSIS — M5481 Occipital neuralgia: Secondary | ICD-10-CM | POA: Diagnosis not present

## 2019-03-05 DIAGNOSIS — R51 Headache: Secondary | ICD-10-CM | POA: Diagnosis not present

## 2019-03-05 DIAGNOSIS — G2581 Restless legs syndrome: Secondary | ICD-10-CM | POA: Diagnosis not present

## 2019-03-05 DIAGNOSIS — Z1211 Encounter for screening for malignant neoplasm of colon: Secondary | ICD-10-CM | POA: Diagnosis not present

## 2019-03-23 ENCOUNTER — Emergency Department (HOSPITAL_COMMUNITY): Payer: Medicare HMO

## 2019-03-23 ENCOUNTER — Emergency Department (HOSPITAL_COMMUNITY)
Admission: EM | Admit: 2019-03-23 | Discharge: 2019-03-23 | Disposition: A | Discharge status: Home / Self Care | Payer: Medicare HMO | Attending: Emergency Medicine | Admitting: Emergency Medicine

## 2019-03-23 ENCOUNTER — Other Ambulatory Visit: Payer: Self-pay

## 2019-03-23 DIAGNOSIS — H539 Unspecified visual disturbance: Secondary | ICD-10-CM | POA: Insufficient documentation

## 2019-03-23 DIAGNOSIS — M6281 Muscle weakness (generalized): Secondary | ICD-10-CM | POA: Diagnosis not present

## 2019-03-23 DIAGNOSIS — H5709 Other anomalies of pupillary function: Secondary | ICD-10-CM | POA: Diagnosis not present

## 2019-03-23 DIAGNOSIS — Z79899 Other long term (current) drug therapy: Secondary | ICD-10-CM | POA: Insufficient documentation

## 2019-03-23 DIAGNOSIS — M542 Cervicalgia: Secondary | ICD-10-CM | POA: Diagnosis not present

## 2019-03-23 DIAGNOSIS — R531 Weakness: Secondary | ICD-10-CM | POA: Diagnosis not present

## 2019-03-23 DIAGNOSIS — G453 Amaurosis fugax: Secondary | ICD-10-CM | POA: Diagnosis not present

## 2019-03-23 DIAGNOSIS — R2689 Other abnormalities of gait and mobility: Secondary | ICD-10-CM | POA: Diagnosis not present

## 2019-03-23 DIAGNOSIS — H538 Other visual disturbances: Secondary | ICD-10-CM | POA: Diagnosis not present

## 2019-03-23 DIAGNOSIS — R4182 Altered mental status, unspecified: Secondary | ICD-10-CM | POA: Diagnosis not present

## 2019-03-23 DIAGNOSIS — I69898 Other sequelae of other cerebrovascular disease: Secondary | ICD-10-CM | POA: Diagnosis not present

## 2019-03-23 LAB — I-STAT CHEM 8, ED
BUN: 13 mg/dL (ref 8–23)
Calcium, Ion: 1.16 mmol/L (ref 1.15–1.40)
Chloride: 103 mmol/L (ref 98–111)
Creatinine, Ser: 0.7 mg/dL (ref 0.44–1.00)
Glucose, Bld: 131 mg/dL — ABNORMAL HIGH (ref 70–99)
HCT: 42 % (ref 36.0–46.0)
Hemoglobin: 14.3 g/dL (ref 12.0–15.0)
Potassium: 4.1 mmol/L (ref 3.5–5.1)
Sodium: 135 mmol/L (ref 135–145)
TCO2: 26 mmol/L (ref 22–32)

## 2019-03-23 LAB — DIFFERENTIAL
Abs Immature Granulocytes: 0.02 10*3/uL (ref 0.00–0.07)
Basophils Absolute: 0.1 10*3/uL (ref 0.0–0.1)
Basophils Relative: 1 %
Eosinophils Absolute: 0.5 10*3/uL (ref 0.0–0.5)
Eosinophils Relative: 6 %
Immature Granulocytes: 0 %
Lymphocytes Relative: 32 %
Lymphs Abs: 2.5 10*3/uL (ref 0.7–4.0)
Monocytes Absolute: 0.8 10*3/uL (ref 0.1–1.0)
Monocytes Relative: 10 %
Neutro Abs: 4 10*3/uL (ref 1.7–7.7)
Neutrophils Relative %: 51 %

## 2019-03-23 LAB — COMPREHENSIVE METABOLIC PANEL
ALT: 18 U/L (ref 0–44)
AST: 24 U/L (ref 15–41)
Albumin: 3.9 g/dL (ref 3.5–5.0)
Alkaline Phosphatase: 57 U/L (ref 38–126)
Anion gap: 10 (ref 5–15)
BUN: 11 mg/dL (ref 8–23)
CO2: 25 mmol/L (ref 22–32)
Calcium: 9.5 mg/dL (ref 8.9–10.3)
Chloride: 101 mmol/L (ref 98–111)
Creatinine, Ser: 0.81 mg/dL (ref 0.44–1.00)
GFR calc Af Amer: >60 mL/min (ref >60)
GFR calc non Af Amer: >60 mL/min (ref >60)
Glucose, Bld: 135 mg/dL — ABNORMAL HIGH (ref 70–99)
Potassium: 4.2 mmol/L (ref 3.5–5.1)
Sodium: 136 mmol/L (ref 135–145)
Total Bilirubin: 0.6 mg/dL (ref 0.3–1.2)
Total Protein: 6.7 g/dL (ref 6.5–8.1)

## 2019-03-23 LAB — CBC
HCT: 42.7 % (ref 36.0–46.0)
Hemoglobin: 14.2 g/dL (ref 12.0–15.0)
MCH: 31.5 pg (ref 26.0–34.0)
MCHC: 33.3 g/dL (ref 30.0–36.0)
MCV: 94.7 fL (ref 80.0–100.0)
Platelets: 252 10*3/uL (ref 150–400)
RBC: 4.51 MIL/uL (ref 3.87–5.11)
RDW: 12.5 % (ref 11.5–15.5)
WBC: 7.8 10*3/uL (ref 4.0–10.5)
nRBC: 0 % (ref 0.0–0.2)

## 2019-03-23 LAB — CBG MONITORING, ED: Glucose-Capillary: 86 mg/dL (ref 70–99)

## 2019-03-23 LAB — APTT: aPTT: 26 seconds (ref 24–36)

## 2019-03-23 LAB — PROTIME-INR
INR: 1 (ref 0.8–1.2)
Prothrombin Time: 13 seconds (ref 11.4–15.2)

## 2019-03-23 LAB — SEDIMENTATION RATE: Sed Rate: 3 mm/hr (ref 0–22)

## 2019-03-23 MED ORDER — SODIUM CHLORIDE 0.9% FLUSH: 3.0000 mL | Freq: Once | INTRAVENOUS | Status: DC

## 2019-03-23 MED ORDER — LORAZEPAM 1 MG PO TABS
1.0000 mg | ORAL_TABLET | Freq: Once | ORAL | Status: AC
  Administered 2019-03-23: 1 mg via ORAL
  Filled 2019-03-23: qty 1

## 2019-03-23 NOTE — ED Notes (Signed)
Discharge instructions discussed with Pt. Pt verbalized understanding. Pt stable and ambulatory.    

## 2019-03-23 NOTE — ED Provider Notes (Addendum)
[facility] EMERGENCY DEPARTMENT Provider Note   CSN: [id number] Arrival date & time: 03/23/19  1227     History   Chief Complaint Chief Complaint  Patient presents with  . Visual Field Change    HPI Katie Hahn is a 85 y.o. female.     Patient states that she has been having difficulty with her vision for a month off and on with ceiling black areas and pupil dilated and feeling electrical shocks in her eyes.  She went to the ophthalmologist today and they did not find anything wrong with her eyes were concerned that she was having a stroke.  Or TIA  The history is provided by the patient. No language interpreter was used.  Eye Problem Location:  Both eyes Quality: Blurred vision. Severity:  Mild Onset quality:  Sudden Timing:  Intermittent Progression:  Waxing and waning Chronicity:  New Relieved by:  Nothing Worsened by:  Nothing Ineffective treatments:  None tried Associated symptoms: no discharge and no headaches     No past medical history on file.  Patient Active Problem List   Diagnosis Date Noted  . INSOMNIA, CHRONIC 05/22/2009  . ALLERGIC RHINITIS 05/22/2009    No past surgical history on file.   OB History   No obstetric history on file.      Home Medications    Prior to Admission medications   Medication Sig Start Date End Date Taking? Authorizing Provider  albuterol (VENTOLIN HFA) 108 (90 Base) MCG/ACT inhaler Inhale 2 puffs into the lungs every 6 (six) hours as needed for wheezing. 02/16/18  Yes [provider]  cetirizine (ZYRTEC) 10 MG chewable tablet Chew 10 mg by mouth daily as needed for allergies.   Yes [provider]  fluticasone (FLONASE) 50 MCG/ACT nasal spray Place 2 sprays into both nostrils as needed for allergies. 02/16/18  Yes [provider]  Ginger, Zingiber officinalis, (GINGER ROOT PO) Take 1 tablet by mouth as needed (nausea).   Yes [provider]  propranolol (INDERAL)  10 MG tablet Take 10 mg by mouth 3 (three) times daily as needed for anxiety.   Yes [provider]  temazepam (RESTORIL) 15 MG capsule Take 15 mg by mouth at bedtime as needed for sleep. 03/06/19  Yes [provider]    Family History No family history on file.  Social History Social History   Tobacco Use  . Smoking status: Not on file  Substance Use Topics  . Alcohol use: Not on file  . Drug use: Not on file     Allergies   Codeine, Diphenhydramine hcl, and Sulfonamide derivatives   Review of Systems Review of Systems  Constitutional: Negative for appetite change and fatigue.  HENT: Negative for congestion, ear discharge and sinus pressure.        Blurred vision  Eyes: Negative for discharge.  Respiratory: Negative for cough.   Cardiovascular: Negative for chest pain.  Gastrointestinal: Negative for abdominal pain and diarrhea.  Genitourinary: Negative for frequency and hematuria.  Musculoskeletal: Negative for back pain.  Skin: Negative for rash.  Neurological: Negative for seizures and headaches.  Psychiatric/Behavioral: Negative for hallucinations.     Physical Exam Updated Vital Signs BP 134/82   Pulse 72   Temp 98.7 F (37.1 C)   Resp 17   SpO2 (!) 89%   Physical Exam Vitals signs and nursing note reviewed.  Constitutional:      Appearance: She is well-developed.  HENT:  Head: Normocephalic.     Comments: Mild dilatation of right pupil greater than left.  Ophthalmology stated that this was not like this when she was seen earlier and is related to the medicine entirely dry    Nose: Nose normal.  Eyes:     General: No scleral icterus.    Conjunctiva/sclera: Conjunctivae normal.  Neck:     Musculoskeletal: Neck supple.     Thyroid: No thyromegaly.  Cardiovascular:     Rate and Rhythm: Normal rate and regular rhythm.     Heart sounds: No murmur. No friction rub. No gallop.   Pulmonary:     Breath sounds: No stridor. No wheezing  or rales.  Chest:     Chest wall: No tenderness.  Abdominal:     General: There is no distension.     Tenderness: There is no abdominal tenderness. There is no rebound.  Musculoskeletal: Normal range of motion.  Lymphadenopathy:     Cervical: No cervical adenopathy.  Skin:    Findings: No erythema or rash.  Neurological:     Mental Status: She is oriented to person, place, and time.     Motor: No abnormal muscle tone.     Coordination: Coordination normal.  Psychiatric:        Behavior: Behavior normal.      ED Treatments / Results  Labs (all labs ordered are listed, but only abnormal results are displayed) Labs Reviewed  COMPREHENSIVE METABOLIC PANEL - Abnormal; Notable for the following components:      Result Value   Glucose, Bld 135 (*)    All other components within normal limits  I-STAT CHEM 8, ED - Abnormal; Notable for the following components:   Glucose, Bld 131 (*)    All other components within normal limits  PROTIME-INR  APTT  CBC  DIFFERENTIAL  SEDIMENTATION RATE  C-REACTIVE PROTEIN  CBG MONITORING, ED    EKG EKG Interpretation  Date/Time:  Friday March 23 2019 12:34:46 EDT Ventricular Rate:  78 PR Interval:  148 QRS Duration: 78 QT Interval:  388 QTC Calculation: 442 R Axis:   15 Text Interpretation:  Normal sinus rhythm Cannot rule out Anterior infarct , age undetermined Abnormal ECG Confirmed by Milton Ferguson [phone]) on 03/23/2019 2:21:03 PM   Radiology Ct Head Wo Contrast  Result Date: 03/23/2019 CLINICAL DATA:  Problems with her vision since yesterday. Generalized weakness. Neck pain. EXAM: CT HEAD WITHOUT CONTRAST TECHNIQUE: Contiguous axial images were obtained from the base of the skull through the vertex without intravenous contrast. COMPARISON:  MRI brain dated May 14, 2016. CT head dated December 12, 2006. FINDINGS: Brain: No evidence of acute infarction, hemorrhage, hydrocephalus, extra-axial collection or mass lesion/mass effect.  Vascular: Atherosclerotic vascular calcification of the carotid siphons. No hyperdense vessel. Skull: Normal. Negative for fracture or focal lesion. Sinuses/Orbits: No acute finding. Unchanged trace right mastoid effusion. Other: None. IMPRESSION: 1.  No acute intracranial abnormality. Electronically Signed   By: Titus Dubin M.D.   On: 03/23/2019 13:43    Procedures Procedures (including critical care time)  Medications Ordered in ED Medications  sodium chloride flush (NS) 0.9 % injection 3 mL (has no administration in time range)  LORazepam (ATIVAN) tablet 1 mg (1 mg Oral Given 03/23/19 1651)     Initial Impression / Assessment and Plan / ED Course  I have reviewed the triage vital signs and the nursing notes.  Pertinent labs & imaging results that were available during my care of the patient were  reviewed by me and considered in my medical decision making (see chart for details).    CRITICAL CARE Performed by: Milton Ferguson Total critical care time: 40  minutes Critical care time was exclusive of separately billable procedures and treating other patients. Critical care was necessary to treat or prevent imminent or life-threatening deterioration. Critical care was time spent personally by me on the following activities: development of treatment plan with patient and/or surrogate as well as nursing, discussions with consultants, evaluation of patient's response to treatment, examination of patient, obtaining history from patient or surrogate, ordering and performing treatments and interventions, ordering and review of laboratory studies, ordering and review of radiographic studies, pulse oximetry and re-evaluation of patient's condition.     Patient with blurred vision possible TIAs.  Neurology was consulted on the phone and they recommended MRI of the brain with CT Angie of the head neck.  The patient has refused the CT but is getting the MRI.  If the MRI does not show an acute stroke  she will go home and take her baby aspirin a day and follow-up with her neurologist  Final Clinical Impressions(s) / ED Diagnoses   Final diagnoses:  None    ED Discharge Orders    None       Milton Ferguson, MD 03/23/19 Melody Haver, MD 03/23/19 [phone]

## 2019-03-23 NOTE — ED Provider Notes (Signed)
Received patient at signout from Dr. Roderic Palau.  Refer to provider note for full history and physical examination.  Briefly, patient is an 85 year old female presenting with complaint of visual changes for 1 month.  She went to ophthalmology today with a reassuring work-up after having her eyes dilated but recommended presentation to the ED for stroke rule out.  Dr. Roderic Palau spoke with Dr. Malen Gauze with neurology who recommended obtaining an MRI and a CTA of the head and neck, the latter of which the patient refused.  If MRI negative, patient likely stable for discharge home with baby aspirin which she has not been taking.  And follow-up with her neurologist in [place].  Physical Exam  BP 134/82   Pulse 72   Temp 98.7 F (37.1 C)   Resp 17   SpO2 (!) 89%   Physical Exam Vitals signs and nursing note reviewed.  Constitutional:      General: She is not in acute distress.    Appearance: She is well-developed.  HENT:     Head: Normocephalic and atraumatic.  Eyes:     General:        Right eye: No discharge.        Left eye: No discharge.     Conjunctiva/sclera: Conjunctivae normal.  Neck:     Vascular: No JVD.     Trachea: No tracheal deviation.  Cardiovascular:     Rate and Rhythm: Normal rate.  Pulmonary:     Effort: Pulmonary effort is normal.  Abdominal:     General: There is no distension.  Skin:    General: Skin is warm and dry.     Findings: No erythema.  Neurological:     Mental Status: She is alert.     Comments: Fluent speech with no evidence of dysarthria or aphasia.  Cranial nerves appear grossly intact.  Moves extremities spontaneously without difficulty.  Ambulatory with steady gait and balance.  Psychiatric:        Behavior: Behavior normal.     MDM  MRI shows no acute abnormalities.  Patient resting comfortably no apparent distress on reassessment.  She would like to go home.  She also states that she would like to find a new neurologist so I will give her  resources for follow-up with neurology in [place].  We had a discussion about her baby aspirin and I recommended reinitiation of and she states that she probably will not start taking it again.  Again recommended follow-up with neurology on an outpatient basis.  Discuss strict ED return precautions.  Patient verbalized understanding of and agreement with plan and patient stable for discharge home at this time.       Debroah Baller 03/23/19 Orlena Sheldon, MD 03/24/19 1535

## 2019-03-23 NOTE — ED Notes (Signed)
Patient transported to MRI 

## 2019-03-23 NOTE — Discharge Instructions (Addendum)
Your work-up today was reassuring that you have not had a stroke.  Start taking baby aspirin daily.  Please follow-up with your neurologist for reevaluation of your symptoms.  I have attached the contact information for to neurology practices in [place] that you can also follow-up with.  Return to the emergency department if any concerning signs or symptoms develop such as severe headaches, weakness to one side of the body, difficulty breathing or swallowing, facial droop, or loss of consciousness.

## 2019-03-23 NOTE — ED Triage Notes (Signed)
Pt states she has had pain to back of her neck for weeks, states her eyes have lightning strikes in her eyes that stared sometime yesterday, possibly in the evening. Pt states she feels generalized weakness but has no problem walking or drift noted to extremities. 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

## 2019-03-27 DIAGNOSIS — G250 Essential tremor: Secondary | ICD-10-CM | POA: Diagnosis not present

## 2019-03-27 DIAGNOSIS — M542 Cervicalgia: Secondary | ICD-10-CM | POA: Diagnosis not present

## 2019-03-27 DIAGNOSIS — G2581 Restless legs syndrome: Secondary | ICD-10-CM | POA: Diagnosis not present

## 2019-03-27 DIAGNOSIS — R69 Illness, unspecified: Secondary | ICD-10-CM | POA: Diagnosis not present

## 2019-03-29 DIAGNOSIS — I6501 Occlusion and stenosis of right vertebral artery: Secondary | ICD-10-CM | POA: Diagnosis not present

## 2019-04-05 DIAGNOSIS — G2581 Restless legs syndrome: Secondary | ICD-10-CM | POA: Diagnosis not present

## 2019-04-05 DIAGNOSIS — G250 Essential tremor: Secondary | ICD-10-CM | POA: Diagnosis not present

## 2019-04-05 DIAGNOSIS — H8112 Benign paroxysmal vertigo, left ear: Secondary | ICD-10-CM | POA: Diagnosis not present

## 2019-04-13 DIAGNOSIS — M9903 Segmental and somatic dysfunction of lumbar region: Secondary | ICD-10-CM | POA: Diagnosis not present

## 2019-04-13 DIAGNOSIS — M9902 Segmental and somatic dysfunction of thoracic region: Secondary | ICD-10-CM | POA: Diagnosis not present

## 2019-04-13 DIAGNOSIS — M9901 Segmental and somatic dysfunction of cervical region: Secondary | ICD-10-CM | POA: Diagnosis not present

## 2019-04-13 DIAGNOSIS — M797 Fibromyalgia: Secondary | ICD-10-CM | POA: Diagnosis not present

## 2019-04-13 DIAGNOSIS — M62830 Muscle spasm of back: Secondary | ICD-10-CM | POA: Diagnosis not present

## 2019-04-18 DIAGNOSIS — K000 Anodontia: Secondary | ICD-10-CM | POA: Diagnosis not present

## 2019-04-20 DIAGNOSIS — M9902 Segmental and somatic dysfunction of thoracic region: Secondary | ICD-10-CM | POA: Diagnosis not present

## 2019-04-20 DIAGNOSIS — M797 Fibromyalgia: Secondary | ICD-10-CM | POA: Diagnosis not present

## 2019-04-20 DIAGNOSIS — M62830 Muscle spasm of back: Secondary | ICD-10-CM | POA: Diagnosis not present

## 2019-04-20 DIAGNOSIS — M9903 Segmental and somatic dysfunction of lumbar region: Secondary | ICD-10-CM | POA: Diagnosis not present

## 2019-04-20 DIAGNOSIS — M9901 Segmental and somatic dysfunction of cervical region: Secondary | ICD-10-CM | POA: Diagnosis not present

## 2019-05-03 DIAGNOSIS — M5481 Occipital neuralgia: Secondary | ICD-10-CM | POA: Diagnosis not present

## 2019-05-03 DIAGNOSIS — G250 Essential tremor: Secondary | ICD-10-CM | POA: Diagnosis not present

## 2019-05-03 DIAGNOSIS — M542 Cervicalgia: Secondary | ICD-10-CM | POA: Diagnosis not present

## 2019-05-03 DIAGNOSIS — R69 Illness, unspecified: Secondary | ICD-10-CM | POA: Diagnosis not present

## 2019-05-03 DIAGNOSIS — G2581 Restless legs syndrome: Secondary | ICD-10-CM | POA: Diagnosis not present

## 2019-05-11 DIAGNOSIS — K000 Anodontia: Secondary | ICD-10-CM | POA: Diagnosis not present

## 2019-06-13 DIAGNOSIS — H5709 Other anomalies of pupillary function: Secondary | ICD-10-CM | POA: Diagnosis not present

## 2019-06-13 DIAGNOSIS — I69898 Other sequelae of other cerebrovascular disease: Secondary | ICD-10-CM | POA: Diagnosis not present

## 2019-06-13 DIAGNOSIS — Z961 Presence of intraocular lens: Secondary | ICD-10-CM | POA: Diagnosis not present

## 2019-06-13 DIAGNOSIS — H04121 Dry eye syndrome of right lacrimal gland: Secondary | ICD-10-CM | POA: Diagnosis not present

## 2019-06-13 DIAGNOSIS — G453 Amaurosis fugax: Secondary | ICD-10-CM | POA: Diagnosis not present

## 2019-06-28 DIAGNOSIS — M5481 Occipital neuralgia: Secondary | ICD-10-CM | POA: Diagnosis not present

## 2019-06-28 DIAGNOSIS — G250 Essential tremor: Secondary | ICD-10-CM | POA: Diagnosis not present

## 2019-06-28 DIAGNOSIS — M542 Cervicalgia: Secondary | ICD-10-CM | POA: Diagnosis not present

## 2019-06-28 DIAGNOSIS — G5603 Carpal tunnel syndrome, bilateral upper limbs: Secondary | ICD-10-CM | POA: Diagnosis not present

## 2019-06-28 DIAGNOSIS — G2581 Restless legs syndrome: Secondary | ICD-10-CM | POA: Diagnosis not present

## 2019-06-28 DIAGNOSIS — R69 Illness, unspecified: Secondary | ICD-10-CM | POA: Diagnosis not present

## 2019-09-13 DIAGNOSIS — S83281S Other tear of lateral meniscus, current injury, right knee, sequela: Secondary | ICD-10-CM | POA: Diagnosis not present

## 2019-09-13 DIAGNOSIS — S83241A Other tear of medial meniscus, current injury, right knee, initial encounter: Secondary | ICD-10-CM | POA: Diagnosis not present

## 2019-09-13 DIAGNOSIS — M2351 Chronic instability of knee, right knee: Secondary | ICD-10-CM | POA: Diagnosis not present

## 2019-09-14 DIAGNOSIS — G2581 Restless legs syndrome: Secondary | ICD-10-CM | POA: Diagnosis not present

## 2019-09-14 DIAGNOSIS — R69 Illness, unspecified: Secondary | ICD-10-CM | POA: Diagnosis not present

## 2019-09-14 DIAGNOSIS — G4700 Insomnia, unspecified: Secondary | ICD-10-CM | POA: Diagnosis not present

## 2019-09-19 DIAGNOSIS — M659 Synovitis and tenosynovitis, unspecified: Secondary | ICD-10-CM | POA: Diagnosis not present

## 2019-09-19 DIAGNOSIS — M94261 Chondromalacia, right knee: Secondary | ICD-10-CM | POA: Diagnosis not present

## 2019-09-19 DIAGNOSIS — M6751 Plica syndrome, right knee: Secondary | ICD-10-CM | POA: Diagnosis not present

## 2019-09-19 DIAGNOSIS — S83231A Complex tear of medial meniscus, current injury, right knee, initial encounter: Secondary | ICD-10-CM | POA: Diagnosis not present

## 2019-09-19 DIAGNOSIS — Y999 Unspecified external cause status: Secondary | ICD-10-CM | POA: Diagnosis not present

## 2019-09-24 DIAGNOSIS — M6281 Muscle weakness (generalized): Secondary | ICD-10-CM | POA: Diagnosis not present

## 2019-09-24 DIAGNOSIS — R2689 Other abnormalities of gait and mobility: Secondary | ICD-10-CM | POA: Diagnosis not present

## 2019-09-24 DIAGNOSIS — S83271D Complex tear of lateral meniscus, current injury, right knee, subsequent encounter: Secondary | ICD-10-CM | POA: Diagnosis not present

## 2019-09-24 DIAGNOSIS — M25561 Pain in right knee: Secondary | ICD-10-CM | POA: Diagnosis not present

## 2019-09-24 DIAGNOSIS — Z9889 Other specified postprocedural states: Secondary | ICD-10-CM | POA: Diagnosis not present

## 2019-09-28 DIAGNOSIS — R300 Dysuria: Secondary | ICD-10-CM | POA: Diagnosis not present

## 2019-11-01 DIAGNOSIS — N3941 Urge incontinence: Secondary | ICD-10-CM | POA: Diagnosis not present

## 2019-11-01 DIAGNOSIS — N3000 Acute cystitis without hematuria: Secondary | ICD-10-CM | POA: Diagnosis not present

## 2019-11-01 DIAGNOSIS — R1084 Generalized abdominal pain: Secondary | ICD-10-CM | POA: Diagnosis not present

## 2019-11-01 DIAGNOSIS — N139 Obstructive and reflux uropathy, unspecified: Secondary | ICD-10-CM | POA: Diagnosis not present

## 2019-11-07 DIAGNOSIS — R69 Illness, unspecified: Secondary | ICD-10-CM | POA: Diagnosis not present

## 2019-11-15 DIAGNOSIS — N8110 Cystocele, unspecified: Secondary | ICD-10-CM | POA: Diagnosis not present

## 2019-11-15 DIAGNOSIS — N3941 Urge incontinence: Secondary | ICD-10-CM | POA: Diagnosis not present

## 2019-11-15 DIAGNOSIS — R3982 Chronic bladder pain: Secondary | ICD-10-CM | POA: Diagnosis not present

## 2019-11-15 DIAGNOSIS — N3000 Acute cystitis without hematuria: Secondary | ICD-10-CM | POA: Diagnosis not present

## 2019-12-13 DIAGNOSIS — L309 Dermatitis, unspecified: Secondary | ICD-10-CM | POA: Diagnosis not present

## 2019-12-13 DIAGNOSIS — L570 Actinic keratosis: Secondary | ICD-10-CM | POA: Diagnosis not present

## 2020-01-08 DIAGNOSIS — R69 Illness, unspecified: Secondary | ICD-10-CM | POA: Diagnosis not present

## 2020-01-17 DIAGNOSIS — Z872 Personal history of diseases of the skin and subcutaneous tissue: Secondary | ICD-10-CM | POA: Diagnosis not present

## 2020-01-17 DIAGNOSIS — L720 Epidermal cyst: Secondary | ICD-10-CM | POA: Diagnosis not present

## 2020-01-17 DIAGNOSIS — L814 Other melanin hyperpigmentation: Secondary | ICD-10-CM | POA: Diagnosis not present

## 2020-01-17 DIAGNOSIS — L2084 Intrinsic (allergic) eczema: Secondary | ICD-10-CM | POA: Diagnosis not present

## 2020-03-13 DIAGNOSIS — R69 Illness, unspecified: Secondary | ICD-10-CM | POA: Diagnosis not present

## 2020-04-08 DIAGNOSIS — M542 Cervicalgia: Secondary | ICD-10-CM | POA: Diagnosis not present

## 2020-04-09 DIAGNOSIS — R69 Illness, unspecified: Secondary | ICD-10-CM | POA: Diagnosis not present

## 2020-04-09 DIAGNOSIS — G250 Essential tremor: Secondary | ICD-10-CM | POA: Diagnosis not present

## 2020-04-09 DIAGNOSIS — G2581 Restless legs syndrome: Secondary | ICD-10-CM | POA: Diagnosis not present

## 2020-04-10 ENCOUNTER — Other Ambulatory Visit: Payer: Self-pay | Admitting: Specialist

## 2020-04-10 DIAGNOSIS — H547 Unspecified visual loss: Secondary | ICD-10-CM

## 2020-04-10 DIAGNOSIS — M5412 Radiculopathy, cervical region: Secondary | ICD-10-CM

## 2020-05-02 DIAGNOSIS — H6123 Impacted cerumen, bilateral: Secondary | ICD-10-CM | POA: Diagnosis not present

## 2020-05-02 DIAGNOSIS — K047 Periapical abscess without sinus: Secondary | ICD-10-CM | POA: Diagnosis not present

## 2020-05-02 DIAGNOSIS — G4700 Insomnia, unspecified: Secondary | ICD-10-CM | POA: Diagnosis not present

## 2020-05-05 ENCOUNTER — Other Ambulatory Visit: Payer: Self-pay

## 2020-05-05 ENCOUNTER — Ambulatory Visit
Admission: RE | Admit: 2020-05-05 | Discharge: 2020-05-05 | Disposition: A | Discharge status: Home / Self Care | Payer: Medicare HMO | Source: Ambulatory Visit | Attending: Specialist | Admitting: Specialist

## 2020-05-05 DIAGNOSIS — R69 Illness, unspecified: Secondary | ICD-10-CM | POA: Diagnosis not present

## 2020-05-05 DIAGNOSIS — M5412 Radiculopathy, cervical region: Secondary | ICD-10-CM

## 2020-05-05 DIAGNOSIS — M4802 Spinal stenosis, cervical region: Secondary | ICD-10-CM | POA: Diagnosis not present

## 2020-05-07 DIAGNOSIS — H547 Unspecified visual loss: Secondary | ICD-10-CM | POA: Diagnosis not present

## 2020-05-07 DIAGNOSIS — R69 Illness, unspecified: Secondary | ICD-10-CM | POA: Diagnosis not present

## 2020-05-07 DIAGNOSIS — M542 Cervicalgia: Secondary | ICD-10-CM | POA: Diagnosis not present

## 2020-05-07 DIAGNOSIS — G2581 Restless legs syndrome: Secondary | ICD-10-CM | POA: Diagnosis not present

## 2020-05-07 DIAGNOSIS — G250 Essential tremor: Secondary | ICD-10-CM | POA: Diagnosis not present

## 2020-06-05 DIAGNOSIS — M84374A Stress fracture, right foot, initial encounter for fracture: Secondary | ICD-10-CM | POA: Diagnosis not present

## 2020-06-10 DIAGNOSIS — J309 Allergic rhinitis, unspecified: Secondary | ICD-10-CM | POA: Diagnosis not present

## 2020-06-10 DIAGNOSIS — H9202 Otalgia, left ear: Secondary | ICD-10-CM | POA: Diagnosis not present

## 2020-06-10 DIAGNOSIS — H6123 Impacted cerumen, bilateral: Secondary | ICD-10-CM | POA: Diagnosis not present

## 2020-06-12 DIAGNOSIS — J342 Deviated nasal septum: Secondary | ICD-10-CM | POA: Diagnosis not present

## 2020-06-12 DIAGNOSIS — H6123 Impacted cerumen, bilateral: Secondary | ICD-10-CM | POA: Diagnosis not present

## 2020-06-12 DIAGNOSIS — M84374D Stress fracture, right foot, subsequent encounter for fracture with routine healing: Secondary | ICD-10-CM | POA: Diagnosis not present

## 2020-06-26 DIAGNOSIS — M84374D Stress fracture, right foot, subsequent encounter for fracture with routine healing: Secondary | ICD-10-CM | POA: Diagnosis not present

## 2020-07-03 DIAGNOSIS — R69 Illness, unspecified: Secondary | ICD-10-CM | POA: Diagnosis not present

## 2020-07-10 DIAGNOSIS — R270 Ataxia, unspecified: Secondary | ICD-10-CM | POA: Diagnosis not present

## 2020-07-10 DIAGNOSIS — G250 Essential tremor: Secondary | ICD-10-CM | POA: Diagnosis not present

## 2020-07-10 DIAGNOSIS — M62838 Other muscle spasm: Secondary | ICD-10-CM | POA: Diagnosis not present

## 2020-07-10 DIAGNOSIS — R69 Illness, unspecified: Secondary | ICD-10-CM | POA: Diagnosis not present

## 2020-07-10 DIAGNOSIS — M542 Cervicalgia: Secondary | ICD-10-CM | POA: Diagnosis not present

## 2020-07-22 DIAGNOSIS — M5412 Radiculopathy, cervical region: Secondary | ICD-10-CM | POA: Diagnosis not present

## 2020-07-22 DIAGNOSIS — G250 Essential tremor: Secondary | ICD-10-CM | POA: Diagnosis not present

## 2020-07-22 DIAGNOSIS — M25571 Pain in right ankle and joints of right foot: Secondary | ICD-10-CM | POA: Diagnosis not present

## 2020-07-22 DIAGNOSIS — R2689 Other abnormalities of gait and mobility: Secondary | ICD-10-CM | POA: Diagnosis not present

## 2020-07-22 DIAGNOSIS — R69 Illness, unspecified: Secondary | ICD-10-CM | POA: Diagnosis not present

## 2020-07-22 DIAGNOSIS — G5603 Carpal tunnel syndrome, bilateral upper limbs: Secondary | ICD-10-CM | POA: Diagnosis not present

## 2020-07-22 DIAGNOSIS — M5417 Radiculopathy, lumbosacral region: Secondary | ICD-10-CM | POA: Diagnosis not present

## 2020-08-13 DIAGNOSIS — D485 Neoplasm of uncertain behavior of skin: Secondary | ICD-10-CM | POA: Diagnosis not present

## 2020-08-13 DIAGNOSIS — C44321 Squamous cell carcinoma of skin of nose: Secondary | ICD-10-CM | POA: Diagnosis not present

## 2020-08-21 DIAGNOSIS — R69 Illness, unspecified: Secondary | ICD-10-CM | POA: Diagnosis not present

## 2020-09-11 DIAGNOSIS — C44321 Squamous cell carcinoma of skin of nose: Secondary | ICD-10-CM | POA: Diagnosis not present

## 2020-09-16 DIAGNOSIS — G4700 Insomnia, unspecified: Secondary | ICD-10-CM | POA: Diagnosis not present

## 2020-09-16 DIAGNOSIS — H539 Unspecified visual disturbance: Secondary | ICD-10-CM | POA: Diagnosis not present

## 2020-09-18 DIAGNOSIS — M79671 Pain in right foot: Secondary | ICD-10-CM | POA: Diagnosis not present

## 2020-09-18 DIAGNOSIS — G250 Essential tremor: Secondary | ICD-10-CM | POA: Diagnosis not present

## 2020-09-18 DIAGNOSIS — R69 Illness, unspecified: Secondary | ICD-10-CM | POA: Diagnosis not present

## 2020-09-18 DIAGNOSIS — M545 Low back pain, unspecified: Secondary | ICD-10-CM | POA: Diagnosis not present

## 2020-09-18 DIAGNOSIS — M542 Cervicalgia: Secondary | ICD-10-CM | POA: Diagnosis not present

## 2020-10-20 DIAGNOSIS — H04123 Dry eye syndrome of bilateral lacrimal glands: Secondary | ICD-10-CM | POA: Diagnosis not present

## 2020-10-20 DIAGNOSIS — G43101 Migraine with aura, not intractable, with status migrainosus: Secondary | ICD-10-CM | POA: Diagnosis not present

## 2020-10-20 DIAGNOSIS — Z961 Presence of intraocular lens: Secondary | ICD-10-CM | POA: Diagnosis not present

## 2020-10-21 DIAGNOSIS — M5416 Radiculopathy, lumbar region: Secondary | ICD-10-CM | POA: Diagnosis not present

## 2020-10-21 DIAGNOSIS — M419 Scoliosis, unspecified: Secondary | ICD-10-CM | POA: Diagnosis not present

## 2020-11-13 DIAGNOSIS — M545 Low back pain, unspecified: Secondary | ICD-10-CM | POA: Diagnosis not present

## 2020-11-13 DIAGNOSIS — G250 Essential tremor: Secondary | ICD-10-CM | POA: Diagnosis not present

## 2020-11-13 DIAGNOSIS — Z8673 Personal history of transient ischemic attack (TIA), and cerebral infarction without residual deficits: Secondary | ICD-10-CM | POA: Diagnosis not present

## 2020-11-13 DIAGNOSIS — R201 Hypoesthesia of skin: Secondary | ICD-10-CM | POA: Diagnosis not present

## 2020-11-13 DIAGNOSIS — R69 Illness, unspecified: Secondary | ICD-10-CM | POA: Diagnosis not present

## 2020-11-13 DIAGNOSIS — M542 Cervicalgia: Secondary | ICD-10-CM | POA: Diagnosis not present

## 2020-12-08 DIAGNOSIS — R3915 Urgency of urination: Secondary | ICD-10-CM | POA: Diagnosis not present

## 2020-12-09 DIAGNOSIS — C44321 Squamous cell carcinoma of skin of nose: Secondary | ICD-10-CM | POA: Diagnosis not present

## 2020-12-09 DIAGNOSIS — D485 Neoplasm of uncertain behavior of skin: Secondary | ICD-10-CM | POA: Diagnosis not present

## 2021-01-23 DIAGNOSIS — H9201 Otalgia, right ear: Secondary | ICD-10-CM | POA: Diagnosis not present

## 2021-01-27 DIAGNOSIS — L578 Other skin changes due to chronic exposure to nonionizing radiation: Secondary | ICD-10-CM | POA: Diagnosis not present

## 2021-01-27 DIAGNOSIS — Z86018 Personal history of other benign neoplasm: Secondary | ICD-10-CM | POA: Diagnosis not present

## 2021-01-27 DIAGNOSIS — L820 Inflamed seborrheic keratosis: Secondary | ICD-10-CM | POA: Diagnosis not present

## 2021-01-27 DIAGNOSIS — L570 Actinic keratosis: Secondary | ICD-10-CM | POA: Diagnosis not present

## 2021-01-27 DIAGNOSIS — D2230 Melanocytic nevi of unspecified part of face: Secondary | ICD-10-CM | POA: Diagnosis not present

## 2021-01-27 DIAGNOSIS — Z85828 Personal history of other malignant neoplasm of skin: Secondary | ICD-10-CM | POA: Diagnosis not present

## 2021-01-27 DIAGNOSIS — D225 Melanocytic nevi of trunk: Secondary | ICD-10-CM | POA: Diagnosis not present

## 2021-02-12 DIAGNOSIS — R059 Cough, unspecified: Secondary | ICD-10-CM | POA: Diagnosis not present

## 2021-02-19 DIAGNOSIS — L570 Actinic keratosis: Secondary | ICD-10-CM | POA: Diagnosis not present

## 2021-03-12 DIAGNOSIS — M810 Age-related osteoporosis without current pathological fracture: Secondary | ICD-10-CM | POA: Diagnosis not present

## 2021-03-12 DIAGNOSIS — Z0000 Encounter for general adult medical examination without abnormal findings: Secondary | ICD-10-CM | POA: Diagnosis not present

## 2021-03-12 DIAGNOSIS — G4700 Insomnia, unspecified: Secondary | ICD-10-CM | POA: Diagnosis not present

## 2021-03-12 DIAGNOSIS — I700 Atherosclerosis of aorta: Secondary | ICD-10-CM | POA: Diagnosis not present

## 2021-03-12 DIAGNOSIS — Z7189 Other specified counseling: Secondary | ICD-10-CM | POA: Diagnosis not present

## 2021-03-12 DIAGNOSIS — G2581 Restless legs syndrome: Secondary | ICD-10-CM | POA: Diagnosis not present

## 2021-03-12 DIAGNOSIS — E7800 Pure hypercholesterolemia, unspecified: Secondary | ICD-10-CM | POA: Diagnosis not present

## 2021-04-30 DIAGNOSIS — L578 Other skin changes due to chronic exposure to nonionizing radiation: Secondary | ICD-10-CM | POA: Diagnosis not present

## 2021-04-30 DIAGNOSIS — D485 Neoplasm of uncertain behavior of skin: Secondary | ICD-10-CM | POA: Diagnosis not present

## 2021-04-30 DIAGNOSIS — L820 Inflamed seborrheic keratosis: Secondary | ICD-10-CM | POA: Diagnosis not present

## 2021-04-30 DIAGNOSIS — L0889 Other specified local infections of the skin and subcutaneous tissue: Secondary | ICD-10-CM | POA: Diagnosis not present

## 2021-04-30 DIAGNOSIS — B079 Viral wart, unspecified: Secondary | ICD-10-CM | POA: Diagnosis not present

## 2021-07-08 DIAGNOSIS — J069 Acute upper respiratory infection, unspecified: Secondary | ICD-10-CM | POA: Diagnosis not present

## 2021-07-09 DIAGNOSIS — G5603 Carpal tunnel syndrome, bilateral upper limbs: Secondary | ICD-10-CM | POA: Diagnosis not present

## 2021-07-09 DIAGNOSIS — G2581 Restless legs syndrome: Secondary | ICD-10-CM | POA: Diagnosis not present

## 2021-07-09 DIAGNOSIS — M5417 Radiculopathy, lumbosacral region: Secondary | ICD-10-CM | POA: Diagnosis not present

## 2021-07-09 DIAGNOSIS — M542 Cervicalgia: Secondary | ICD-10-CM | POA: Diagnosis not present

## 2021-07-09 DIAGNOSIS — G250 Essential tremor: Secondary | ICD-10-CM | POA: Diagnosis not present

## 2021-08-06 DIAGNOSIS — M5417 Radiculopathy, lumbosacral region: Secondary | ICD-10-CM | POA: Diagnosis not present

## 2021-08-06 DIAGNOSIS — G250 Essential tremor: Secondary | ICD-10-CM | POA: Diagnosis not present

## 2021-08-06 DIAGNOSIS — G5603 Carpal tunnel syndrome, bilateral upper limbs: Secondary | ICD-10-CM | POA: Diagnosis not present

## 2021-08-06 DIAGNOSIS — M5412 Radiculopathy, cervical region: Secondary | ICD-10-CM | POA: Diagnosis not present

## 2021-08-06 DIAGNOSIS — G2581 Restless legs syndrome: Secondary | ICD-10-CM | POA: Diagnosis not present

## 2021-08-06 DIAGNOSIS — R252 Cramp and spasm: Secondary | ICD-10-CM | POA: Diagnosis not present

## 2021-08-06 DIAGNOSIS — R531 Weakness: Secondary | ICD-10-CM | POA: Diagnosis not present

## 2021-09-17 DIAGNOSIS — L570 Actinic keratosis: Secondary | ICD-10-CM | POA: Diagnosis not present

## 2021-11-26 DIAGNOSIS — M542 Cervicalgia: Secondary | ICD-10-CM | POA: Diagnosis not present

## 2021-11-26 DIAGNOSIS — M545 Low back pain, unspecified: Secondary | ICD-10-CM | POA: Diagnosis not present

## 2021-11-26 DIAGNOSIS — F5101 Primary insomnia: Secondary | ICD-10-CM | POA: Diagnosis not present

## 2021-11-26 DIAGNOSIS — G2581 Restless legs syndrome: Secondary | ICD-10-CM | POA: Diagnosis not present

## 2021-12-31 DIAGNOSIS — D485 Neoplasm of uncertain behavior of skin: Secondary | ICD-10-CM | POA: Diagnosis not present

## 2021-12-31 DIAGNOSIS — L570 Actinic keratosis: Secondary | ICD-10-CM | POA: Diagnosis not present

## 2021-12-31 DIAGNOSIS — L738 Other specified follicular disorders: Secondary | ICD-10-CM | POA: Diagnosis not present

## 2022-01-21 DIAGNOSIS — F5101 Primary insomnia: Secondary | ICD-10-CM | POA: Diagnosis not present

## 2022-01-21 DIAGNOSIS — R259 Unspecified abnormal involuntary movements: Secondary | ICD-10-CM | POA: Diagnosis not present

## 2022-01-21 DIAGNOSIS — M545 Low back pain, unspecified: Secondary | ICD-10-CM | POA: Diagnosis not present

## 2022-01-21 DIAGNOSIS — Z79899 Other long term (current) drug therapy: Secondary | ICD-10-CM | POA: Diagnosis not present

## 2022-02-22 DIAGNOSIS — L578 Other skin changes due to chronic exposure to nonionizing radiation: Secondary | ICD-10-CM | POA: Diagnosis not present

## 2022-02-22 DIAGNOSIS — Z85828 Personal history of other malignant neoplasm of skin: Secondary | ICD-10-CM | POA: Diagnosis not present

## 2022-02-22 DIAGNOSIS — D225 Melanocytic nevi of trunk: Secondary | ICD-10-CM | POA: Diagnosis not present

## 2022-02-22 DIAGNOSIS — L821 Other seborrheic keratosis: Secondary | ICD-10-CM | POA: Diagnosis not present

## 2022-03-06 DIAGNOSIS — H6123 Impacted cerumen, bilateral: Secondary | ICD-10-CM | POA: Diagnosis not present

## 2022-03-15 DIAGNOSIS — G4700 Insomnia, unspecified: Secondary | ICD-10-CM | POA: Diagnosis not present

## 2022-03-15 DIAGNOSIS — H9202 Otalgia, left ear: Secondary | ICD-10-CM | POA: Diagnosis not present

## 2022-03-15 DIAGNOSIS — R42 Dizziness and giddiness: Secondary | ICD-10-CM | POA: Diagnosis not present

## 2022-04-06 DIAGNOSIS — D485 Neoplasm of uncertain behavior of skin: Secondary | ICD-10-CM | POA: Diagnosis not present

## 2022-04-06 DIAGNOSIS — D0439 Carcinoma in situ of skin of other parts of face: Secondary | ICD-10-CM | POA: Diagnosis not present

## 2022-04-12 ENCOUNTER — Other Ambulatory Visit (HOSPITAL_BASED_OUTPATIENT_CLINIC_OR_DEPARTMENT_OTHER): Payer: Self-pay | Admitting: Family Medicine

## 2022-04-12 ENCOUNTER — Ambulatory Visit (HOSPITAL_BASED_OUTPATIENT_CLINIC_OR_DEPARTMENT_OTHER)
Admission: RE | Admit: 2022-04-12 | Discharge: 2022-04-12 | Disposition: A | Discharge status: Home / Self Care | Payer: Medicare Other | Source: Ambulatory Visit | Attending: Family Medicine | Admitting: Family Medicine

## 2022-04-12 DIAGNOSIS — R600 Localized edema: Secondary | ICD-10-CM | POA: Diagnosis not present

## 2022-04-12 DIAGNOSIS — M25561 Pain in right knee: Secondary | ICD-10-CM | POA: Insufficient documentation

## 2022-04-13 ENCOUNTER — Ambulatory Visit (HOSPITAL_BASED_OUTPATIENT_CLINIC_OR_DEPARTMENT_OTHER): Payer: Medicare Other

## 2022-04-27 DIAGNOSIS — M1711 Unilateral primary osteoarthritis, right knee: Secondary | ICD-10-CM | POA: Diagnosis not present

## 2022-04-27 DIAGNOSIS — M7121 Synovial cyst of popliteal space [Baker], right knee: Secondary | ICD-10-CM | POA: Diagnosis not present

## 2022-06-15 DIAGNOSIS — H5319 Other subjective visual disturbances: Secondary | ICD-10-CM | POA: Diagnosis not present

## 2022-06-15 DIAGNOSIS — H5022 Vertical strabismus, left eye: Secondary | ICD-10-CM | POA: Diagnosis not present

## 2022-06-15 DIAGNOSIS — Z961 Presence of intraocular lens: Secondary | ICD-10-CM | POA: Diagnosis not present

## 2022-06-15 DIAGNOSIS — H40053 Ocular hypertension, bilateral: Secondary | ICD-10-CM | POA: Diagnosis not present

## 2022-06-29 DIAGNOSIS — G2581 Restless legs syndrome: Secondary | ICD-10-CM | POA: Diagnosis not present

## 2022-06-29 DIAGNOSIS — F5101 Primary insomnia: Secondary | ICD-10-CM | POA: Diagnosis not present

## 2022-06-29 DIAGNOSIS — R259 Unspecified abnormal involuntary movements: Secondary | ICD-10-CM | POA: Diagnosis not present

## 2022-06-29 DIAGNOSIS — M542 Cervicalgia: Secondary | ICD-10-CM | POA: Diagnosis not present

## 2022-07-19 DIAGNOSIS — M545 Low back pain, unspecified: Secondary | ICD-10-CM | POA: Diagnosis not present

## 2022-07-19 DIAGNOSIS — N390 Urinary tract infection, site not specified: Secondary | ICD-10-CM | POA: Diagnosis not present

## 2022-07-19 DIAGNOSIS — B3731 Acute candidiasis of vulva and vagina: Secondary | ICD-10-CM | POA: Diagnosis not present

## 2022-07-21 DIAGNOSIS — H5022 Vertical strabismus, left eye: Secondary | ICD-10-CM | POA: Diagnosis not present

## 2022-07-21 DIAGNOSIS — H40053 Ocular hypertension, bilateral: Secondary | ICD-10-CM | POA: Diagnosis not present

## 2022-07-21 DIAGNOSIS — H5319 Other subjective visual disturbances: Secondary | ICD-10-CM | POA: Diagnosis not present

## 2022-07-21 DIAGNOSIS — Z961 Presence of intraocular lens: Secondary | ICD-10-CM | POA: Diagnosis not present

## 2022-07-22 ENCOUNTER — Other Ambulatory Visit: Payer: Self-pay | Admitting: Family Medicine

## 2022-07-22 DIAGNOSIS — D0439 Carcinoma in situ of skin of other parts of face: Secondary | ICD-10-CM | POA: Diagnosis not present

## 2022-07-22 DIAGNOSIS — R109 Unspecified abdominal pain: Secondary | ICD-10-CM

## 2022-07-27 ENCOUNTER — Other Ambulatory Visit: Payer: Medicare Other

## 2022-07-27 ENCOUNTER — Ambulatory Visit
Admission: RE | Admit: 2022-07-27 | Discharge: 2022-07-27 | Disposition: A | Discharge status: Home / Self Care | Payer: Medicare Other | Source: Ambulatory Visit | Attending: Family Medicine | Admitting: Family Medicine

## 2022-07-27 DIAGNOSIS — R109 Unspecified abdominal pain: Secondary | ICD-10-CM

## 2022-08-06 DIAGNOSIS — S0010XA Contusion of unspecified eyelid and periocular area, initial encounter: Secondary | ICD-10-CM | POA: Diagnosis not present

## 2022-08-10 DIAGNOSIS — M419 Scoliosis, unspecified: Secondary | ICD-10-CM | POA: Diagnosis not present

## 2022-08-10 DIAGNOSIS — M7918 Myalgia, other site: Secondary | ICD-10-CM | POA: Diagnosis not present

## 2022-08-18 ENCOUNTER — Other Ambulatory Visit: Payer: Self-pay | Admitting: Orthopaedic Surgery

## 2022-08-18 DIAGNOSIS — M5416 Radiculopathy, lumbar region: Secondary | ICD-10-CM

## 2022-08-23 ENCOUNTER — Emergency Department (HOSPITAL_BASED_OUTPATIENT_CLINIC_OR_DEPARTMENT_OTHER): Payer: Medicare Other | Admitting: Radiology

## 2022-08-23 ENCOUNTER — Emergency Department (HOSPITAL_BASED_OUTPATIENT_CLINIC_OR_DEPARTMENT_OTHER)
Admission: EM | Admit: 2022-08-23 | Discharge: 2022-08-24 | Disposition: A | Discharge status: Home / Self Care | Payer: Medicare Other | Attending: Emergency Medicine | Admitting: Emergency Medicine

## 2022-08-23 ENCOUNTER — Emergency Department (HOSPITAL_BASED_OUTPATIENT_CLINIC_OR_DEPARTMENT_OTHER): Payer: Medicare Other

## 2022-08-23 ENCOUNTER — Encounter (HOSPITAL_BASED_OUTPATIENT_CLINIC_OR_DEPARTMENT_OTHER): Payer: Self-pay | Admitting: Emergency Medicine

## 2022-08-23 ENCOUNTER — Other Ambulatory Visit: Payer: Self-pay

## 2022-08-23 DIAGNOSIS — S0083XA Contusion of other part of head, initial encounter: Secondary | ICD-10-CM | POA: Insufficient documentation

## 2022-08-23 DIAGNOSIS — R296 Repeated falls: Secondary | ICD-10-CM

## 2022-08-23 DIAGNOSIS — R300 Dysuria: Secondary | ICD-10-CM | POA: Insufficient documentation

## 2022-08-23 DIAGNOSIS — W010XXA Fall on same level from slipping, tripping and stumbling without subsequent striking against object, initial encounter: Secondary | ICD-10-CM | POA: Diagnosis not present

## 2022-08-23 DIAGNOSIS — S0993XA Unspecified injury of face, initial encounter: Secondary | ICD-10-CM | POA: Diagnosis not present

## 2022-08-23 DIAGNOSIS — M4802 Spinal stenosis, cervical region: Secondary | ICD-10-CM | POA: Diagnosis not present

## 2022-08-23 DIAGNOSIS — Z043 Encounter for examination and observation following other accident: Secondary | ICD-10-CM | POA: Diagnosis not present

## 2022-08-23 DIAGNOSIS — R531 Weakness: Secondary | ICD-10-CM | POA: Diagnosis not present

## 2022-08-23 DIAGNOSIS — I729 Aneurysm of unspecified site: Secondary | ICD-10-CM | POA: Diagnosis not present

## 2022-08-23 DIAGNOSIS — R42 Dizziness and giddiness: Secondary | ICD-10-CM | POA: Diagnosis not present

## 2022-08-23 DIAGNOSIS — S0990XA Unspecified injury of head, initial encounter: Secondary | ICD-10-CM | POA: Diagnosis not present

## 2022-08-23 LAB — CBC WITH DIFFERENTIAL/PLATELET
Abs Immature Granulocytes: 0.22 10*3/uL — ABNORMAL HIGH (ref 0.00–0.07)
Basophils Absolute: 0.1 10*3/uL (ref 0.0–0.1)
Basophils Relative: 1 %
Eosinophils Absolute: 0.2 10*3/uL (ref 0.0–0.5)
Eosinophils Relative: 2 %
HCT: 38.4 % (ref 36.0–46.0)
Hemoglobin: 13.1 g/dL (ref 12.0–15.0)
Immature Granulocytes: 2 %
Lymphocytes Relative: 18 %
Lymphs Abs: 2 10*3/uL (ref 0.7–4.0)
MCH: 33 pg (ref 26.0–34.0)
MCHC: 34.1 g/dL (ref 30.0–36.0)
MCV: 96.7 fL (ref 80.0–100.0)
Monocytes Absolute: 1.2 10*3/uL — ABNORMAL HIGH (ref 0.1–1.0)
Monocytes Relative: 11 %
Neutro Abs: 7.3 10*3/uL (ref 1.7–7.7)
Neutrophils Relative %: 66 %
Platelets: 228 10*3/uL (ref 150–400)
RBC: 3.97 MIL/uL (ref 3.87–5.11)
RDW: 14.5 % (ref 11.5–15.5)
WBC: 11 10*3/uL — ABNORMAL HIGH (ref 4.0–10.5)
nRBC: 0 % (ref 0.0–0.2)

## 2022-08-23 LAB — COMPREHENSIVE METABOLIC PANEL
ALT: 15 U/L (ref 0–44)
AST: 16 U/L (ref 15–41)
Albumin: 4 g/dL (ref 3.5–5.0)
Alkaline Phosphatase: 34 U/L — ABNORMAL LOW (ref 38–126)
Anion gap: 8 (ref 5–15)
BUN: 15 mg/dL (ref 8–23)
CO2: 28 mmol/L (ref 22–32)
Calcium: 9 mg/dL (ref 8.9–10.3)
Chloride: 102 mmol/L (ref 98–111)
Creatinine, Ser: 0.74 mg/dL (ref 0.44–1.00)
GFR, Estimated: >60 mL/min (ref >60)
Glucose, Bld: 119 mg/dL — ABNORMAL HIGH (ref 70–99)
Potassium: 3.3 mmol/L — ABNORMAL LOW (ref 3.5–5.1)
Sodium: 138 mmol/L (ref 135–145)
Total Bilirubin: 0.7 mg/dL (ref 0.3–1.2)
Total Protein: 6.4 g/dL — ABNORMAL LOW (ref 6.5–8.1)

## 2022-08-23 MED ORDER — IOHEXOL 350 MG/ML SOLN
100.0000 mL | Freq: Once | INTRAVENOUS | Status: AC | PRN
  Administered 2022-08-24: 75 mL via INTRAVENOUS

## 2022-08-23 NOTE — ED Triage Notes (Signed)
Pt here form home with c/o fall after a trip and fall today , pt is c/o right side head pain neck and ear pain , no loc

## 2022-08-23 NOTE — ED Provider Notes (Signed)
[name]ERGENC[facility] DEPT Provider Note   CSN: [id number] Arrival date & time: 08/23/22  1610     History  Chief Complaint  Patient presents with   Lytle Michaels    Katie Hahn is a 88 y.o. female.   Fall  Patient presents with dizziness and falls.  Had a fall around 3 PM today.  Has episodes where she feels off and then falls hard.  States she just tripped on the rug.  States she has had 3 falls over the last 3 weeks and has had no falls for that.  States she feels a little off.  Has had dysuria and urinary frequency for about that time also.  States she has had some chills.  States people keep telling her she could have a urinary tract infection.  States bruising on her face is from one of the falls.  States that when she turns her head to the left she feels sloshing in her ears and states she cannot really do it because of dizziness.  Patient states she has been told in the past that one of the big vessels in the back of her neck is plugged.    History reviewed. No pertinent past medical history.  Home Medications Prior to Admission medications   Medication Sig Start Date End Date Taking? Authorizing Provider  albuterol (VENTOLIN HFA) 108 (90 Base) MCG/ACT inhaler Inhale 2 puffs into the lungs every 6 (six) hours as needed for wheezing. 02/16/18   [provider]  cetirizine (ZYRTEC) 10 MG chewable tablet Chew 10 mg by mouth daily as needed for allergies.    [provider]  fluticasone (FLONASE) 50 MCG/ACT nasal spray Place 2 sprays into both nostrils as needed for allergies. 02/16/18   [provider]  Ginger, Zingiber officinalis, (GINGER ROOT PO) Take 1 tablet by mouth as needed (nausea).    [provider]  propranolol (INDERAL) 10 MG tablet Take 10 mg by mouth 3 (three) times daily as needed for anxiety.    [provider]  temazepam (RESTORIL) 15 MG capsule Take 15 mg by mouth at bedtime as needed for sleep. 03/06/19    [provider]      Allergies    Codeine, Diphenhydramine hcl, and Sulfonamide derivatives    Review of Systems   Review of Systems  Physical Exam Updated Vital Signs BP (!) 167/77 (BP Location: Right Arm)   Pulse 66   Temp 98.4 F (36.9 C) (Oral)   Resp (!) 22   SpO2 99%  Physical Exam Vitals and nursing note reviewed.  HENT:     Head:     Comments: Ecchymosis left face.    Right Ear: Tympanic membrane normal.     Left Ear: Tympanic membrane normal.  Eyes:     Extraocular Movements: Extraocular movements intact.     Pupils: Pupils are equal, round, and reactive to light.  Cardiovascular:     Rate and Rhythm: Regular rhythm.  Abdominal:     Tenderness: There is no abdominal tenderness.  Musculoskeletal:     Cervical back: Neck supple. No tenderness.  Skin:    General: Skin is warm.  Neurological:     Mental Status: She is alert and oriented to person, place, and time.     Comments: Finger-nose intact and heel shin intact.     ED Results / Procedures / Treatments   Labs (all labs ordered are listed, but only abnormal results are displayed) Labs Reviewed  CBC WITH  DIFFERENTIAL/PLATELET - Abnormal; Notable for the following components:      Result Value   WBC 11.0 (*)    Monocytes Absolute 1.2 (*)    Abs Immature Granulocytes 0.22 (*)    All other components within normal limits  COMPREHENSIVE METABOLIC PANEL  URINALYSIS, ROUTINE W REFLEX MICROSCOPIC    EKG None  Radiology CT Head Wo Contrast  Result Date: 08/23/2022 CLINICAL DATA:  Fall. EXAM: CT HEAD WITHOUT CONTRAST CT MAXILLOFACIAL WITHOUT CONTRAST CT CERVICAL SPINE WITHOUT CONTRAST TECHNIQUE: Multidetector CT imaging of the head, cervical spine, and maxillofacial structures were performed using the standard protocol without intravenous contrast. Multiplanar CT image reconstructions of the cervical spine and maxillofacial structures were also generated. RADIATION DOSE REDUCTION: This exam was  performed according to the departmental dose-optimization program which includes automated exposure control, adjustment of the mA and/or kV according to patient size and/or use of iterative reconstruction technique. COMPARISON:  CT head 03/23/2019 FINDINGS: CT HEAD FINDINGS Brain: No evidence of acute infarction, hemorrhage, hydrocephalus, extra-axial collection or mass lesion/mass effect. Vascular: Atherosclerotic calcifications are present within the cavernous internal carotid arteries. Skull: Normal. Negative for fracture or focal lesion. Other: None. CT MAXILLOFACIAL FINDINGS Osseous: No fracture or mandibular dislocation. No destructive process. Orbits: Negative. No traumatic or inflammatory finding. Sinuses: Clear. Soft tissues: Negative. CT CERVICAL SPINE FINDINGS Alignment: Normal. Skull base and vertebrae: The bones are osteopenic. No acute fracture identified. No focal osseous lesion. Soft tissues and spinal canal: No prevertebral fluid or swelling. No visible canal hematoma. Disc levels: There is intervertebral disc space narrowing throughout the cervical spine. Endplate osteophyte formation is seen throughout most significant at C6-C7. There is mild left neural foraminal stenosis at C5-C6 and C6-C7 secondary to uncovertebral spurring. No significant central canal stenosis identified at any level. Upper chest: Negative. Other: None. IMPRESSION: 1. No acute intracranial abnormality. 2. No acute facial fracture. 3. No acute fracture or traumatic subluxation of the cervical spine. Electronically Signed   By: Ronney Asters M.D.   On: 08/23/2022 19:43   CT Cervical Spine Wo Contrast  Result Date: 08/23/2022 CLINICAL DATA:  Fall. EXAM: CT HEAD WITHOUT CONTRAST CT MAXILLOFACIAL WITHOUT CONTRAST CT CERVICAL SPINE WITHOUT CONTRAST TECHNIQUE: Multidetector CT imaging of the head, cervical spine, and maxillofacial structures were performed using the standard protocol without intravenous contrast. Multiplanar CT  image reconstructions of the cervical spine and maxillofacial structures were also generated. RADIATION DOSE REDUCTION: This exam was performed according to the departmental dose-optimization program which includes automated exposure control, adjustment of the mA and/or kV according to patient size and/or use of iterative reconstruction technique. COMPARISON:  CT head 03/23/2019 FINDINGS: CT HEAD FINDINGS Brain: No evidence of acute infarction, hemorrhage, hydrocephalus, extra-axial collection or mass lesion/mass effect. Vascular: Atherosclerotic calcifications are present within the cavernous internal carotid arteries. Skull: Normal. Negative for fracture or focal lesion. Other: None. CT MAXILLOFACIAL FINDINGS Osseous: No fracture or mandibular dislocation. No destructive process. Orbits: Negative. No traumatic or inflammatory finding. Sinuses: Clear. Soft tissues: Negative. CT CERVICAL SPINE FINDINGS Alignment: Normal. Skull base and vertebrae: The bones are osteopenic. No acute fracture identified. No focal osseous lesion. Soft tissues and spinal canal: No prevertebral fluid or swelling. No visible canal hematoma. Disc levels: There is intervertebral disc space narrowing throughout the cervical spine. Endplate osteophyte formation is seen throughout most significant at C6-C7. There is mild left neural foraminal stenosis at C5-C6 and C6-C7 secondary to uncovertebral spurring. No significant central canal stenosis identified at any level. Upper chest: Negative.  Other: None. IMPRESSION: 1. No acute intracranial abnormality. 2. No acute facial fracture. 3. No acute fracture or traumatic subluxation of the cervical spine. Electronically Signed   By: Ronney Asters M.D.   On: 08/23/2022 19:43   CT Maxillofacial Wo Contrast  Result Date: 08/23/2022 CLINICAL DATA:  Fall. EXAM: CT HEAD WITHOUT CONTRAST CT MAXILLOFACIAL WITHOUT CONTRAST CT CERVICAL SPINE WITHOUT CONTRAST TECHNIQUE: Multidetector CT imaging of the head,  cervical spine, and maxillofacial structures were performed using the standard protocol without intravenous contrast. Multiplanar CT image reconstructions of the cervical spine and maxillofacial structures were also generated. RADIATION DOSE REDUCTION: This exam was performed according to the departmental dose-optimization program which includes automated exposure control, adjustment of the mA and/or kV according to patient size and/or use of iterative reconstruction technique. COMPARISON:  CT head 03/23/2019 FINDINGS: CT HEAD FINDINGS Brain: No evidence of acute infarction, hemorrhage, hydrocephalus, extra-axial collection or mass lesion/mass effect. Vascular: Atherosclerotic calcifications are present within the cavernous internal carotid arteries. Skull: Normal. Negative for fracture or focal lesion. Other: None. CT MAXILLOFACIAL FINDINGS Osseous: No fracture or mandibular dislocation. No destructive process. Orbits: Negative. No traumatic or inflammatory finding. Sinuses: Clear. Soft tissues: Negative. CT CERVICAL SPINE FINDINGS Alignment: Normal. Skull base and vertebrae: The bones are osteopenic. No acute fracture identified. No focal osseous lesion. Soft tissues and spinal canal: No prevertebral fluid or swelling. No visible canal hematoma. Disc levels: There is intervertebral disc space narrowing throughout the cervical spine. Endplate osteophyte formation is seen throughout most significant at C6-C7. There is mild left neural foraminal stenosis at C5-C6 and C6-C7 secondary to uncovertebral spurring. No significant central canal stenosis identified at any level. Upper chest: Negative. Other: None. IMPRESSION: 1. No acute intracranial abnormality. 2. No acute facial fracture. 3. No acute fracture or traumatic subluxation of the cervical spine. Electronically Signed   By: Ronney Asters M.D.   On: 08/23/2022 19:43    Procedures Procedures    Medications Ordered in ED Medications - No data to  display  ED Course/ Medical Decision Making/ A&P                           Medical Decision Making Amount and/or Complexity of Data Reviewed Labs: ordered. Radiology: ordered.   Patient with fall.  3 falls over the last 3 weeks.  States she is more unsteady and feels off.  Differential diagnosis is long but includes central and peripheral cause of vertigo.  Also different traumatic injuries.  Reviewed notes from previous MRIs.  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  Also had previous aneurysm.  Head CT facial CT and cervical spine CT reassuring but will now get angiography.  Stroke also considered.  However also has had dysuria and potentially could have UTI which could cause her to be more unsteady.  Will get urinalysis.    Care will be turned over to Dr Dina Rich.        Final Clinical Impression(s) / ED Diagnoses Final diagnoses:  None    Rx / DC Orders ED Discharge Orders     None         Davonna Belling, MD 08/23/22 2317

## 2022-08-23 NOTE — ED Provider Triage Note (Signed)
Emergency Medicine Provider Triage Evaluation Note  Katie Hahn , a 88 y.o. female  was evaluated in triage.  Pt complains of head and neck pain following a fall around 3PM today. States she was walking when her toe got caught under the rug and caused her to fall.  Denies lightheadedness or dizziness, chest pain, or shortness of breath prior to or following fall.  Denies vision loss.  Not on anticoagulation.  No other complaints at this time.  Review of Systems  Positive: See above Negative:   Physical Exam  BP (!) 162/77 (BP Location: Right Arm)   Pulse 70   Temp 97.9 F (36.6 C)   Resp 18   SpO2 99%  Gen:   Awake, no distress   Resp:  Normal effort  MSK:   Moves extremities without difficulty  Other:  Sitting comfortably.  Gaze aligned appropriately.  No obvious wound, deformity, battle sign's, or raccoon eyes appreciated.  With midline C-spine tenderness.  Medical Decision Making  Medically screening exam initiated at 6:15 PM.  Appropriate orders placed.  Katie Hahn was informed that the remainder of the evaluation will be completed by another provider, this initial triage assessment does not replace that evaluation, and the importance of remaining in the ED until their evaluation is complete.  Imaging ordered.   Prince Rome, PA-C 28/63/81 1817

## 2022-08-24 ENCOUNTER — Emergency Department (HOSPITAL_BASED_OUTPATIENT_CLINIC_OR_DEPARTMENT_OTHER): Payer: Medicare Other

## 2022-08-24 DIAGNOSIS — R531 Weakness: Secondary | ICD-10-CM | POA: Diagnosis not present

## 2022-08-24 DIAGNOSIS — R42 Dizziness and giddiness: Secondary | ICD-10-CM | POA: Diagnosis not present

## 2022-08-24 LAB — URINALYSIS, ROUTINE W REFLEX MICROSCOPIC
Bilirubin Urine: NEGATIVE
Glucose, UA: NEGATIVE mg/dL
Hgb urine dipstick: NEGATIVE
Ketones, ur: NEGATIVE mg/dL
Nitrite: NEGATIVE
Protein, ur: NEGATIVE mg/dL
Specific Gravity, Urine: 1.025 (ref 1.005–1.030)
pH: 6.5 (ref 5.0–8.0)

## 2022-08-24 NOTE — Discharge Instructions (Signed)
You are seen today for frequent falls.  Your workup is largely reassuring.  Your CT angio does show 3 likely small aneurysms that are not bleeding.  Unclear whether this is related to your frequent falls.  Follow-up closely with your neurologist.

## 2022-08-24 NOTE — ED Provider Notes (Signed)
Patient signed out pending follow-up urinalysis and CTA.  Urinalysis with moderate leuk esterase, 11-20 white cells and rare bacteria.  I did send a urine culture although patient reports mild to no symptoms.  Unclear whether this is related to her current complaints.  CTA shows 3 small likely aneurysms.  2 of which were likely present on prior MRI.  None of them are leaking.  Do not feel these are contributing to the patient's current symptoms.  Patient is adamant about leaving.  She does have neurology follow-up.  She is awake, alert, oriented.  Feel this is reasonable.  She was advised if she develops acute headache or neurologic symptoms, she needs to be reevaluated immediately. Physical Exam  BP (!) 159/89   Pulse 80   Temp 98.4 F (36.9 C) (Oral)   Resp 20   SpO2 100%   Physical Exam Awake, alert, no acute distress, ecchymosis left orbit Procedures  Procedures  ED Course / MDM    Medical Decision Making Amount and/or Complexity of Data Reviewed Labs: ordered. Radiology: ordered.  Risk Prescription drug management.   Problem List Items Addressed This Visit   None Visit Diagnoses     Frequent falls    -  Primary   Aneurysm ([place])                 Dina Rich, Barbette Hair, MD 08/24/22 [phone]

## 2022-08-24 NOTE — ED Notes (Signed)
Patient verbalizes understanding of discharge instructions. Opportunity for questioning and answers were provided. Armband removed by staff, pt discharged from ED. Wheeled out to grandson's car, who will be driving her home and staying the night with her

## 2022-08-25 LAB — URINE CULTURE: Culture: 50000 — AB

## 2022-08-26 ENCOUNTER — Telehealth (HOSPITAL_BASED_OUTPATIENT_CLINIC_OR_DEPARTMENT_OTHER): Payer: Self-pay

## 2022-08-26 NOTE — Progress Notes (Signed)
ED Antimicrobial Stewardship Positive Culture Follow Up   Katie Hahn is an 88 y.o. female who presented to [facility] on 08/23/2022 with a chief complaint of  Chief Complaint  Patient presents with   Fall    Recent Results (from the past 71 hour(s))  Urine Culture     Status: Abnormal   Collection Time: 08/24/22 12:18 AM   Specimen: Urine, Clean Catch  Result Value Ref Range Status   Specimen Description   Final    URINE, CLEAN CATCH Performed at [facility], [address] [place], [place] [address]    Special Requests   Final    NONE Performed at [facility], [address], [place], [place] [address]    Culture (A)  Final    50,000 COLONIES/mL GROUP B STREP(S.AGALACTIAE)ISOLATED TESTING AGAINST S. AGALACTIAE NOT ROUTINELY PERFORMED DUE TO PREDICTABILITY OF AMP/PEN/VAN SUSCEPTIBILITY. Performed at [facility], [place] [address], [place], [place] [address]    Report Status 08/25/2022 FINAL  Final    '[x]'$  Patient discharged originally without antimicrobial agent and treatment is now indicated given pt's report of mild symptoms.  New antibiotic prescription: Amoxicillin '500mg'$  by mouth every 12 hours x 5 days  ED Provider: Myna Bright, PA-C   Luisa Hart, PharmD, BCPS Clinical Pharmacist 08/26/2022 11:23 AM  Please refer to [facility] for pharmacy phone number Monday - Friday phone -  [phone] Saturday - Sunday phone - [phone]

## 2022-08-26 NOTE — Telephone Encounter (Signed)
Post ED Visit - Positive Culture Follow-up: Successful Patient Follow-Up  Culture assessed and recommendations reviewed by:  '[]'$  Elenor Quinones, Pharm.D. '[]'$  Heide Guile, Pharm.D., BCPS AQ-ID '[x]'$  Luisa Hart, Pharm.D., BCPS '[]'$  Alycia Rossetti, Pharm.D., BCPS '[]'$  [place], [place].D., BCPS, AAHIVP '[]'$  Legrand Como, Pharm.D., BCPS, AAHIVP '[]'$  Salome Arnt, PharmD, BCPS '[]'$  Johnnette Gourd, PharmD, BCPS '[]'$  Hughes Better, PharmD, BCPS '[]'$  Leeroy Cha, PharmD  Positive urine culture  '[x]'$  Patient discharged without antimicrobial prescription and treatment is now indicated '[]'$  Organism is resistant to prescribed ED discharge antimicrobial '[]'$  Patient with positive blood cultures  Changes discussed with ED provider: Myna Bright, PA-C New antibiotic prescription Amoxicillin 500 mg po Q 12 hours x 5 days Called to [facility] on [address] patient, date 08/26/2022, time 12:10 pm   Glennon Hamilton 08/26/2022, 12:10 PM

## 2022-09-08 DIAGNOSIS — H6123 Impacted cerumen, bilateral: Secondary | ICD-10-CM | POA: Diagnosis not present

## 2022-09-08 DIAGNOSIS — H8111 Benign paroxysmal vertigo, right ear: Secondary | ICD-10-CM | POA: Diagnosis not present

## 2022-09-14 ENCOUNTER — Ambulatory Visit
Admission: RE | Admit: 2022-09-14 | Discharge: 2022-09-14 | Disposition: A | Discharge status: Home / Self Care | Payer: Medicare Other | Source: Ambulatory Visit | Attending: Orthopaedic Surgery | Admitting: Orthopaedic Surgery

## 2022-09-14 DIAGNOSIS — M5136 Other intervertebral disc degeneration, lumbar region: Secondary | ICD-10-CM | POA: Diagnosis not present

## 2022-09-14 DIAGNOSIS — M5416 Radiculopathy, lumbar region: Secondary | ICD-10-CM

## 2022-09-14 DIAGNOSIS — M48061 Spinal stenosis, lumbar region without neurogenic claudication: Secondary | ICD-10-CM | POA: Diagnosis not present

## 2022-09-14 DIAGNOSIS — M545 Low back pain, unspecified: Secondary | ICD-10-CM | POA: Diagnosis not present

## 2022-09-22 DIAGNOSIS — R251 Tremor, unspecified: Secondary | ICD-10-CM | POA: Diagnosis not present

## 2022-09-22 DIAGNOSIS — R82998 Other abnormal findings in urine: Secondary | ICD-10-CM | POA: Diagnosis not present

## 2022-09-22 DIAGNOSIS — I700 Atherosclerosis of aorta: Secondary | ICD-10-CM | POA: Diagnosis not present

## 2022-09-22 DIAGNOSIS — N390 Urinary tract infection, site not specified: Secondary | ICD-10-CM | POA: Diagnosis not present

## 2022-09-23 DIAGNOSIS — H524 Presbyopia: Secondary | ICD-10-CM | POA: Diagnosis not present

## 2022-09-27 DIAGNOSIS — K080 Exfoliation of teeth due to systemic causes: Secondary | ICD-10-CM | POA: Diagnosis not present

## 2022-09-29 DIAGNOSIS — M419 Scoliosis, unspecified: Secondary | ICD-10-CM | POA: Diagnosis not present

## 2022-09-29 DIAGNOSIS — M791 Myalgia, unspecified site: Secondary | ICD-10-CM | POA: Diagnosis not present

## 2022-10-19 DIAGNOSIS — R82998 Other abnormal findings in urine: Secondary | ICD-10-CM | POA: Diagnosis not present

## 2022-10-19 DIAGNOSIS — R8290 Unspecified abnormal findings in urine: Secondary | ICD-10-CM | POA: Diagnosis not present

## 2022-11-01 DIAGNOSIS — M1711 Unilateral primary osteoarthritis, right knee: Secondary | ICD-10-CM | POA: Diagnosis not present

## 2022-11-01 DIAGNOSIS — M25561 Pain in right knee: Secondary | ICD-10-CM | POA: Diagnosis not present

## 2022-11-01 DIAGNOSIS — G8929 Other chronic pain: Secondary | ICD-10-CM | POA: Diagnosis not present

## 2022-11-18 DIAGNOSIS — D0439 Carcinoma in situ of skin of other parts of face: Secondary | ICD-10-CM | POA: Diagnosis not present

## 2022-11-23 DIAGNOSIS — K080 Exfoliation of teeth due to systemic causes: Secondary | ICD-10-CM | POA: Diagnosis not present

## 2022-12-08 DIAGNOSIS — G8929 Other chronic pain: Secondary | ICD-10-CM | POA: Diagnosis not present

## 2022-12-08 DIAGNOSIS — M25561 Pain in right knee: Secondary | ICD-10-CM | POA: Diagnosis not present

## 2022-12-27 DIAGNOSIS — M1711 Unilateral primary osteoarthritis, right knee: Secondary | ICD-10-CM | POA: Diagnosis not present

## 2023-01-04 DIAGNOSIS — K080 Exfoliation of teeth due to systemic causes: Secondary | ICD-10-CM | POA: Diagnosis not present

## 2023-02-10 DIAGNOSIS — M7912 Myalgia of auxiliary muscles, head and neck: Secondary | ICD-10-CM | POA: Diagnosis not present

## 2023-02-10 DIAGNOSIS — F419 Anxiety disorder, unspecified: Secondary | ICD-10-CM | POA: Diagnosis not present

## 2023-02-16 DIAGNOSIS — R053 Chronic cough: Secondary | ICD-10-CM | POA: Diagnosis not present

## 2023-02-16 DIAGNOSIS — Z0000 Encounter for general adult medical examination without abnormal findings: Secondary | ICD-10-CM | POA: Diagnosis not present

## 2023-02-16 DIAGNOSIS — F411 Generalized anxiety disorder: Secondary | ICD-10-CM | POA: Diagnosis not present

## 2023-02-24 DIAGNOSIS — T63441A Toxic effect of venom of bees, accidental (unintentional), initial encounter: Secondary | ICD-10-CM | POA: Diagnosis not present

## 2023-03-21 DIAGNOSIS — K080 Exfoliation of teeth due to systemic causes: Secondary | ICD-10-CM | POA: Diagnosis not present

## 2023-03-24 DIAGNOSIS — M1711 Unilateral primary osteoarthritis, right knee: Secondary | ICD-10-CM | POA: Diagnosis not present

## 2023-04-06 DIAGNOSIS — E7800 Pure hypercholesterolemia, unspecified: Secondary | ICD-10-CM | POA: Diagnosis not present

## 2023-04-06 DIAGNOSIS — G4700 Insomnia, unspecified: Secondary | ICD-10-CM | POA: Diagnosis not present

## 2023-04-06 DIAGNOSIS — Z0000 Encounter for general adult medical examination without abnormal findings: Secondary | ICD-10-CM | POA: Diagnosis not present

## 2023-04-06 DIAGNOSIS — M810 Age-related osteoporosis without current pathological fracture: Secondary | ICD-10-CM | POA: Diagnosis not present

## 2023-04-06 DIAGNOSIS — I700 Atherosclerosis of aorta: Secondary | ICD-10-CM | POA: Diagnosis not present

## 2023-05-13 DIAGNOSIS — L820 Inflamed seborrheic keratosis: Secondary | ICD-10-CM | POA: Diagnosis not present

## 2023-05-13 DIAGNOSIS — L821 Other seborrheic keratosis: Secondary | ICD-10-CM | POA: Diagnosis not present

## 2023-05-13 DIAGNOSIS — L578 Other skin changes due to chronic exposure to nonionizing radiation: Secondary | ICD-10-CM | POA: Diagnosis not present

## 2023-05-13 DIAGNOSIS — D485 Neoplasm of uncertain behavior of skin: Secondary | ICD-10-CM | POA: Diagnosis not present

## 2023-05-13 DIAGNOSIS — D225 Melanocytic nevi of trunk: Secondary | ICD-10-CM | POA: Diagnosis not present

## 2023-05-13 DIAGNOSIS — L570 Actinic keratosis: Secondary | ICD-10-CM | POA: Diagnosis not present

## 2023-06-16 DIAGNOSIS — R251 Tremor, unspecified: Secondary | ICD-10-CM | POA: Diagnosis not present

## 2023-06-16 DIAGNOSIS — M7912 Myalgia of auxiliary muscles, head and neck: Secondary | ICD-10-CM | POA: Diagnosis not present

## 2023-06-21 DIAGNOSIS — G8929 Other chronic pain: Secondary | ICD-10-CM | POA: Diagnosis not present

## 2023-06-21 DIAGNOSIS — M25561 Pain in right knee: Secondary | ICD-10-CM | POA: Diagnosis not present

## 2023-07-11 DIAGNOSIS — H16141 Punctate keratitis, right eye: Secondary | ICD-10-CM | POA: Diagnosis not present

## 2023-07-11 DIAGNOSIS — H0102A Squamous blepharitis right eye, upper and lower eyelids: Secondary | ICD-10-CM | POA: Diagnosis not present

## 2023-07-11 DIAGNOSIS — H0102B Squamous blepharitis left eye, upper and lower eyelids: Secondary | ICD-10-CM | POA: Diagnosis not present

## 2023-07-28 DIAGNOSIS — F5101 Primary insomnia: Secondary | ICD-10-CM | POA: Diagnosis not present

## 2023-07-28 DIAGNOSIS — M7912 Myalgia of auxiliary muscles, head and neck: Secondary | ICD-10-CM | POA: Diagnosis not present

## 2023-09-15 DIAGNOSIS — G43109 Migraine with aura, not intractable, without status migrainosus: Secondary | ICD-10-CM | POA: Diagnosis not present

## 2023-09-15 DIAGNOSIS — H02403 Unspecified ptosis of bilateral eyelids: Secondary | ICD-10-CM | POA: Diagnosis not present

## 2023-09-21 DIAGNOSIS — M1711 Unilateral primary osteoarthritis, right knee: Secondary | ICD-10-CM | POA: Diagnosis not present

## 2023-09-21 DIAGNOSIS — M25561 Pain in right knee: Secondary | ICD-10-CM | POA: Diagnosis not present

## 2023-09-21 DIAGNOSIS — G8929 Other chronic pain: Secondary | ICD-10-CM | POA: Diagnosis not present

## 2023-09-23 DIAGNOSIS — H8111 Benign paroxysmal vertigo, right ear: Secondary | ICD-10-CM | POA: Diagnosis not present

## 2023-09-23 DIAGNOSIS — H6123 Impacted cerumen, bilateral: Secondary | ICD-10-CM | POA: Diagnosis not present

## 2023-10-03 DIAGNOSIS — K080 Exfoliation of teeth due to systemic causes: Secondary | ICD-10-CM | POA: Diagnosis not present

## 2023-10-27 DIAGNOSIS — F419 Anxiety disorder, unspecified: Secondary | ICD-10-CM | POA: Diagnosis not present

## 2023-10-27 DIAGNOSIS — M7912 Myalgia of auxiliary muscles, head and neck: Secondary | ICD-10-CM | POA: Diagnosis not present

## 2024-01-19 DIAGNOSIS — G43109 Migraine with aura, not intractable, without status migrainosus: Secondary | ICD-10-CM | POA: Diagnosis not present

## 2024-01-19 DIAGNOSIS — H5709 Other anomalies of pupillary function: Secondary | ICD-10-CM | POA: Diagnosis not present

## 2024-01-19 DIAGNOSIS — M7912 Myalgia of auxiliary muscles, head and neck: Secondary | ICD-10-CM | POA: Diagnosis not present

## 2024-01-19 DIAGNOSIS — G453 Amaurosis fugax: Secondary | ICD-10-CM | POA: Diagnosis not present

## 2024-01-19 DIAGNOSIS — H02403 Unspecified ptosis of bilateral eyelids: Secondary | ICD-10-CM | POA: Diagnosis not present

## 2024-01-19 DIAGNOSIS — M7918 Myalgia, other site: Secondary | ICD-10-CM | POA: Diagnosis not present

## 2024-02-02 DIAGNOSIS — S80862A Insect bite (nonvenomous), left lower leg, initial encounter: Secondary | ICD-10-CM | POA: Diagnosis not present

## 2024-02-14 DIAGNOSIS — Z0000 Encounter for general adult medical examination without abnormal findings: Secondary | ICD-10-CM | POA: Diagnosis not present

## 2024-02-14 DIAGNOSIS — E7800 Pure hypercholesterolemia, unspecified: Secondary | ICD-10-CM | POA: Diagnosis not present

## 2024-02-14 DIAGNOSIS — M810 Age-related osteoporosis without current pathological fracture: Secondary | ICD-10-CM | POA: Diagnosis not present

## 2024-02-14 DIAGNOSIS — Z23 Encounter for immunization: Secondary | ICD-10-CM | POA: Diagnosis not present

## 2024-02-14 DIAGNOSIS — G4700 Insomnia, unspecified: Secondary | ICD-10-CM | POA: Diagnosis not present

## 2024-03-06 DIAGNOSIS — H9201 Otalgia, right ear: Secondary | ICD-10-CM | POA: Diagnosis not present

## 2024-03-06 DIAGNOSIS — Z6823 Body mass index (BMI) 23.0-23.9, adult: Secondary | ICD-10-CM | POA: Diagnosis not present

## 2024-03-06 DIAGNOSIS — B3731 Acute candidiasis of vulva and vagina: Secondary | ICD-10-CM | POA: Diagnosis not present

## 2024-03-19 DIAGNOSIS — H02423 Myogenic ptosis of bilateral eyelids: Secondary | ICD-10-CM | POA: Diagnosis not present

## 2024-03-19 DIAGNOSIS — H0279 Other degenerative disorders of eyelid and periocular area: Secondary | ICD-10-CM | POA: Diagnosis not present

## 2024-03-19 DIAGNOSIS — H02413 Mechanical ptosis of bilateral eyelids: Secondary | ICD-10-CM | POA: Diagnosis not present

## 2024-03-23 DIAGNOSIS — Z6823 Body mass index (BMI) 23.0-23.9, adult: Secondary | ICD-10-CM | POA: Diagnosis not present

## 2024-03-23 DIAGNOSIS — R300 Dysuria: Secondary | ICD-10-CM | POA: Diagnosis not present

## 2024-03-23 DIAGNOSIS — N760 Acute vaginitis: Secondary | ICD-10-CM | POA: Diagnosis not present

## 2024-03-29 DIAGNOSIS — M4120 Other idiopathic scoliosis, site unspecified: Secondary | ICD-10-CM | POA: Diagnosis not present

## 2024-03-29 DIAGNOSIS — M7912 Myalgia of auxiliary muscles, head and neck: Secondary | ICD-10-CM | POA: Diagnosis not present

## 2024-04-03 DIAGNOSIS — M25561 Pain in right knee: Secondary | ICD-10-CM | POA: Diagnosis not present

## 2024-04-03 DIAGNOSIS — G8929 Other chronic pain: Secondary | ICD-10-CM | POA: Diagnosis not present

## 2024-04-03 DIAGNOSIS — M1711 Unilateral primary osteoarthritis, right knee: Secondary | ICD-10-CM | POA: Diagnosis not present

## 2024-04-19 DIAGNOSIS — M47817 Spondylosis without myelopathy or radiculopathy, lumbosacral region: Secondary | ICD-10-CM | POA: Diagnosis not present

## 2024-05-04 DIAGNOSIS — Z01818 Encounter for other preprocedural examination: Secondary | ICD-10-CM | POA: Diagnosis not present

## 2024-05-04 DIAGNOSIS — N760 Acute vaginitis: Secondary | ICD-10-CM | POA: Diagnosis not present

## 2024-05-24 DIAGNOSIS — L570 Actinic keratosis: Secondary | ICD-10-CM | POA: Diagnosis not present

## 2024-05-24 DIAGNOSIS — D485 Neoplasm of uncertain behavior of skin: Secondary | ICD-10-CM | POA: Diagnosis not present

## 2024-05-24 DIAGNOSIS — L309 Dermatitis, unspecified: Secondary | ICD-10-CM | POA: Diagnosis not present

## 2024-05-24 DIAGNOSIS — L299 Pruritus, unspecified: Secondary | ICD-10-CM | POA: Diagnosis not present

## 2024-05-24 DIAGNOSIS — L578 Other skin changes due to chronic exposure to nonionizing radiation: Secondary | ICD-10-CM | POA: Diagnosis not present

## 2024-05-24 DIAGNOSIS — N898 Other specified noninflammatory disorders of vagina: Secondary | ICD-10-CM | POA: Diagnosis not present

## 2024-06-25 DIAGNOSIS — M25761 Osteophyte, right knee: Secondary | ICD-10-CM | POA: Diagnosis not present

## 2024-06-25 DIAGNOSIS — G8918 Other acute postprocedural pain: Secondary | ICD-10-CM | POA: Diagnosis not present

## 2024-06-25 DIAGNOSIS — Z96651 Presence of right artificial knee joint: Secondary | ICD-10-CM | POA: Diagnosis not present

## 2024-06-25 DIAGNOSIS — M1711 Unilateral primary osteoarthritis, right knee: Secondary | ICD-10-CM | POA: Diagnosis not present

## 2024-07-10 DIAGNOSIS — M25561 Pain in right knee: Secondary | ICD-10-CM | POA: Diagnosis not present

## 2024-07-10 DIAGNOSIS — M1711 Unilateral primary osteoarthritis, right knee: Secondary | ICD-10-CM | POA: Diagnosis not present

## 2024-07-13 DIAGNOSIS — M1711 Unilateral primary osteoarthritis, right knee: Secondary | ICD-10-CM | POA: Diagnosis not present

## 2024-07-13 DIAGNOSIS — M25561 Pain in right knee: Secondary | ICD-10-CM | POA: Diagnosis not present

## 2024-07-18 DIAGNOSIS — M25561 Pain in right knee: Secondary | ICD-10-CM | POA: Diagnosis not present

## 2024-07-18 DIAGNOSIS — M1711 Unilateral primary osteoarthritis, right knee: Secondary | ICD-10-CM | POA: Diagnosis not present

## 2024-07-20 DIAGNOSIS — M25561 Pain in right knee: Secondary | ICD-10-CM | POA: Diagnosis not present

## 2024-07-20 DIAGNOSIS — M1711 Unilateral primary osteoarthritis, right knee: Secondary | ICD-10-CM | POA: Diagnosis not present

## 2024-07-23 DIAGNOSIS — M25561 Pain in right knee: Secondary | ICD-10-CM | POA: Diagnosis not present

## 2024-07-23 DIAGNOSIS — M1711 Unilateral primary osteoarthritis, right knee: Secondary | ICD-10-CM | POA: Diagnosis not present

## 2024-07-26 DIAGNOSIS — M1711 Unilateral primary osteoarthritis, right knee: Secondary | ICD-10-CM | POA: Diagnosis not present

## 2024-07-26 DIAGNOSIS — M25561 Pain in right knee: Secondary | ICD-10-CM | POA: Diagnosis not present

## 2024-07-30 DIAGNOSIS — M1711 Unilateral primary osteoarthritis, right knee: Secondary | ICD-10-CM | POA: Diagnosis not present

## 2024-07-30 DIAGNOSIS — M25561 Pain in right knee: Secondary | ICD-10-CM | POA: Diagnosis not present

## 2024-08-01 DIAGNOSIS — L259 Unspecified contact dermatitis, unspecified cause: Secondary | ICD-10-CM | POA: Diagnosis not present

## 2024-08-07 DIAGNOSIS — Z471 Aftercare following joint replacement surgery: Secondary | ICD-10-CM | POA: Diagnosis not present

## 2024-08-07 DIAGNOSIS — Z96651 Presence of right artificial knee joint: Secondary | ICD-10-CM | POA: Diagnosis not present

## 2024-09-18 ENCOUNTER — Other Ambulatory Visit: Payer: Self-pay | Admitting: Physician Assistant

## 2024-09-19 LAB — DERMATOLOGY PATHOLOGY
